# Patient Record
Sex: Male | Born: 1945 | Race: Black or African American | Hispanic: No | Marital: Single | Smoking: Former smoker
Health system: Southern US, Community
[De-identification: ages and names within clinical notes are randomized; demographics above are authoritative.]

## PROBLEM LIST (undated history)

## (undated) DIAGNOSIS — E119 Type 2 diabetes mellitus without complications: Secondary | ICD-10-CM

## (undated) DIAGNOSIS — M1990 Unspecified osteoarthritis, unspecified site: Secondary | ICD-10-CM

## (undated) DIAGNOSIS — K219 Gastro-esophageal reflux disease without esophagitis: Secondary | ICD-10-CM

## (undated) DIAGNOSIS — B20 Human immunodeficiency virus [HIV] disease: Secondary | ICD-10-CM

## (undated) DIAGNOSIS — I10 Essential (primary) hypertension: Secondary | ICD-10-CM

## (undated) DIAGNOSIS — Z21 Asymptomatic human immunodeficiency virus [HIV] infection status: Secondary | ICD-10-CM

---

## 2021-02-17 ENCOUNTER — Other Ambulatory Visit: Payer: Self-pay | Admitting: Neurosurgery

## 2021-03-07 NOTE — Pre-Procedure Instructions (Signed)
Surgical Instructions    Your procedure is scheduled on Friday June 17th  Report to [facility] Main Entrance "A" at 08:00 A.M., then check in with the Admitting office.  Call this number if you have problems the morning of surgery:  [phone]   If you have any questions prior to your surgery date call [phone]: Open Monday-Friday 8am-4pm    Remember:  Do not eat or drink after midnight the night before your surgery      Take these medicines the morning of surgery with A SIP OF WATER  ictegravir-emtricitabine-tenofovir AF (BIKTARVY) ezetimibe (ZETIA) pantoprazole (PROTONIX) rosuvastatin (CRESTOR)   As of today, STOP taking any Aspirin (unless otherwise instructed by your surgeon) Aleve, Naproxen, Ibuprofen, Motrin, Advil, Goody's, BC's, all herbal medications, fish oil, and all vitamins.  WHAT DO I DO ABOUT MY DIABETES MEDICATION?   Do not take oral diabetes medicines (pills) the morning of surgery.   Do not take empagliflozin (JARDIANCE) the day before surgery or the morning of surgery.   Do not take metFORMIN (GLUCOPHAGE) the morning of surgery.   THE NIGHT BEFORE SURGERY, take 12.5 units (50% of normal dose) of  insulin glargine if needed         THE NIGHT BEFORE SURGERY take your usual dose of insulin lispro (HUMALOG) before supper if      needed.   The day of surgery, do not take other diabetes injectables, including Byetta (exenatide), Bydureon (exenatide ER), Victoza (liraglutide), or Trulicity (dulaglutide).  If your CBG is greater than 220 mg/dL, you may take  of your sliding scale (correction) dose of insulin.   HOW TO MANAGE YOUR DIABETES BEFORE AND AFTER SURGERY  Why is it important to control my blood sugar before and after surgery? Improving blood sugar levels before and after surgery helps healing and can limit problems. A way of improving blood sugar control is eating a healthy diet by:  Eating less sugar and carbohydrates  Increasing  activity/exercise  Talking with your doctor about reaching your blood sugar goals High blood sugars (greater than 180 mg/dL) can raise your risk of infections and slow your recovery, so you will need to focus on controlling your diabetes during the weeks before surgery. Make sure that the doctor who takes care of your diabetes knows about your planned surgery including the date and location.  How do I manage my blood sugar before surgery? Check your blood sugar at least 4 times a day, starting 2 days before surgery, to make sure that the level is not too high or low.  Check your blood sugar the morning of your surgery when you wake up and every 2 hours until you get to the Short Stay unit.  If your blood sugar is less than 70 mg/dL, you will need to treat for low blood sugar: Do not take insulin. Treat a low blood sugar (less than 70 mg/dL) with  cup of clear juice (cranberry or apple), 4 glucose tablets, OR glucose gel. Recheck blood sugar in 15 minutes after treatment (to make sure it is greater than 70 mg/dL). If your blood sugar is not greater than 70 mg/dL on recheck, call [phone] for further instructions. Report your blood sugar to the short stay nurse when you get to Short Stay.  If you are admitted to the hospital after surgery: Your blood sugar will be checked by the staff and you will probably be given insulin after surgery (instead of oral diabetes medicines) to make sure you have  good blood sugar levels. The goal for blood sugar control after surgery is 80-180 mg/dL.                      Do NOT Smoke (Tobacco/Vaping) or drink Alcohol 24 hours prior to your procedure.  If you use a CPAP at night, you may bring all equipment for your overnight stay.   Contacts, glasses, piercing's, hearing aid's, dentures or partials may not be worn into surgery, please bring cases for these belongings.    For patients admitted to the hospital, discharge time will be determined by your  treatment team.   Patients discharged the day of surgery will not be allowed to drive home, and someone needs to stay with them for 24 hours.    Special instructions:   - Preparing For Surgery  Before surgery, you can play an important role. Because skin is not sterile, your skin needs to be as free of germs as possible. You can reduce the number of germs on your skin by washing with CHG (chlorahexidine gluconate) Soap before surgery.  CHG is an antiseptic cleaner which kills germs and bonds with the skin to continue killing germs even after washing.    Oral Hygiene is also important to reduce your risk of infection.  Remember - BRUSH YOUR TEETH THE MORNING OF SURGERY WITH YOUR REGULAR TOOTHPASTE  Please do not use if you have an allergy to CHG or antibacterial soaps. If your skin becomes reddened/irritated stop using the CHG.  Do not shave (including legs and underarms) for at least 48 hours prior to first CHG shower. It is OK to shave your face.  Please follow these instructions carefully.   Shower the NIGHT BEFORE SURGERY and the MORNING OF SURGERY  If you chose to wash your hair, wash your hair first as usual with your normal shampoo.  After you shampoo, rinse your hair and body thoroughly to remove the shampoo.  Use CHG Soap as you would any other liquid soap. You can apply CHG directly to the skin and wash gently with a scrungie or a clean washcloth.   Apply the CHG Soap to your body ONLY FROM THE NECK DOWN.  Do not use on open wounds or open sores. Avoid contact with your eyes, ears, mouth and genitals (private parts). Wash Face and genitals (private parts)  with your normal soap.   Wash thoroughly, paying special attention to the area where your surgery will be performed.  Thoroughly rinse your body with warm water from the neck down.  DO NOT shower/wash with your normal soap after using and rinsing off the CHG Soap.  Pat yourself dry with a CLEAN TOWEL.  Wear  CLEAN PAJAMAS to bed the night before surgery  Place CLEAN SHEETS on your bed the night before your surgery  DO NOT SLEEP WITH PETS.   Day of Surgery: Shower with CHG soap. Do not wear jewelry, make up or nail polish. Do not wear lotions, powders, perfumes/colognes, or deodorant. Do not shave 48 hours prior to surgery.  Men may shave face and neck. Do not bring valuables to the hospital. [facility] is not responsible for any belongings or valuables. Wear Clean/Comfortable clothing the morning of surgery Remember to brush your teeth WITH YOUR REGULAR TOOTHPASTE.   Please read over the following fact sheets that you were given.

## 2021-03-08 ENCOUNTER — Encounter (HOSPITAL_COMMUNITY)
Admission: RE | Admit: 2021-03-08 | Discharge: 2021-03-08 | Disposition: A | Discharge status: Home / Self Care | Payer: No Typology Code available for payment source | Source: Ambulatory Visit | Attending: Neurosurgery | Admitting: Neurosurgery

## 2021-03-08 ENCOUNTER — Other Ambulatory Visit: Payer: Self-pay

## 2021-03-08 ENCOUNTER — Encounter (HOSPITAL_COMMUNITY): Payer: Self-pay

## 2021-03-08 DIAGNOSIS — I10 Essential (primary) hypertension: Secondary | ICD-10-CM | POA: Insufficient documentation

## 2021-03-08 DIAGNOSIS — Z79899 Other long term (current) drug therapy: Secondary | ICD-10-CM | POA: Insufficient documentation

## 2021-03-08 DIAGNOSIS — E119 Type 2 diabetes mellitus without complications: Secondary | ICD-10-CM | POA: Insufficient documentation

## 2021-03-08 DIAGNOSIS — M4802 Spinal stenosis, cervical region: Secondary | ICD-10-CM | POA: Insufficient documentation

## 2021-03-08 DIAGNOSIS — Z20822 Contact with and (suspected) exposure to covid-19: Secondary | ICD-10-CM | POA: Insufficient documentation

## 2021-03-08 DIAGNOSIS — Z87891 Personal history of nicotine dependence: Secondary | ICD-10-CM | POA: Insufficient documentation

## 2021-03-08 DIAGNOSIS — Z01818 Encounter for other preprocedural examination: Secondary | ICD-10-CM | POA: Insufficient documentation

## 2021-03-08 DIAGNOSIS — Z7984 Long term (current) use of oral hypoglycemic drugs: Secondary | ICD-10-CM | POA: Insufficient documentation

## 2021-03-08 HISTORY — DX: Unspecified osteoarthritis, unspecified site: M19.90

## 2021-03-08 HISTORY — DX: Type 2 diabetes mellitus without complications: E11.9

## 2021-03-08 HISTORY — DX: Essential (primary) hypertension: I10

## 2021-03-08 HISTORY — DX: Gastro-esophageal reflux disease without esophagitis: K21.9

## 2021-03-08 LAB — BASIC METABOLIC PANEL
Anion gap: 12 (ref 5–15)
BUN: 7 mg/dL — ABNORMAL LOW (ref 8–23)
CO2: 25 mmol/L (ref 22–32)
Calcium: 9.3 mg/dL (ref 8.9–10.3)
Chloride: 104 mmol/L (ref 98–111)
Creatinine, Ser: 0.79 mg/dL (ref 0.61–1.24)
GFR, Estimated: >60 mL/min (ref >60)
Glucose, Bld: 110 mg/dL — ABNORMAL HIGH (ref 70–99)
Potassium: 3.6 mmol/L (ref 3.5–5.1)
Sodium: 141 mmol/L (ref 135–145)

## 2021-03-08 LAB — CBC WITH DIFFERENTIAL/PLATELET
Abs Immature Granulocytes: 0.02 10*3/uL (ref 0.00–0.07)
Basophils Absolute: 0 10*3/uL (ref 0.0–0.1)
Basophils Relative: 1 %
Eosinophils Absolute: 0.2 10*3/uL (ref 0.0–0.5)
Eosinophils Relative: 4 %
HCT: 40.3 % (ref 39.0–52.0)
Hemoglobin: 13.1 g/dL (ref 13.0–17.0)
Immature Granulocytes: 1 %
Lymphocytes Relative: 33 %
Lymphs Abs: 1.4 10*3/uL (ref 0.7–4.0)
MCH: 30.7 pg (ref 26.0–34.0)
MCHC: 32.5 g/dL (ref 30.0–36.0)
MCV: 94.4 fL (ref 80.0–100.0)
Monocytes Absolute: 0.4 10*3/uL (ref 0.1–1.0)
Monocytes Relative: 10 %
Neutro Abs: 2.2 10*3/uL (ref 1.7–7.7)
Neutrophils Relative %: 51 %
Platelets: 147 10*3/uL — ABNORMAL LOW (ref 150–400)
RBC: 4.27 MIL/uL (ref 4.22–5.81)
RDW: 13.3 % (ref 11.5–15.5)
WBC: 4.1 10*3/uL (ref 4.0–10.5)
nRBC: 0 % (ref 0.0–0.2)

## 2021-03-08 LAB — SARS CORONAVIRUS 2 (TAT 6-24 HRS): SARS Coronavirus 2: NEGATIVE

## 2021-03-08 LAB — SURGICAL PCR SCREEN
MRSA, PCR: NEGATIVE
Staphylococcus aureus: NEGATIVE

## 2021-03-08 LAB — GLUCOSE, CAPILLARY: Glucose-Capillary: 103 mg/dL — ABNORMAL HIGH (ref 70–99)

## 2021-03-08 NOTE — Progress Notes (Signed)
PCP - Dr. Joetta Manners Cardiologist - denies  PPM/ICD - n/a Device Orders - n/a Rep Notified - n/a  Chest x-ray - n/a EKG - 03/08/21 Stress Test - denies ECHO - denies Cardiac Cath - denies  Sleep Study - denies CPAP - n/a  CBG today= 103 Patient wears a Dexacon G6 on his right abdomen Patient states blood sugar ranges from 113-123   Blood Thinner Instructions: n/a Aspirin Instructions: n/a  ERAS Protcol - NPO after midnight   COVID TEST- 03/08/21 Pending   Anesthesia review: Yes. EKG review. BP 153/97 and 157/103 when rechecked. Called and made Jaynie Collins, PA aware. Patient states he takes Lisinopril nightly and he thought "Lisinopril was a medication for blood sugar." Patient states he is scheduled to see Dr. Joetta Manners at [facility] on Thursday for routine follow up. Per Jaynie Collins patient knows to inform Dr. Carolyne Fiscal of his upcoming surgery and today's BP readings so she is aware also.   Patient denies shortness of breath, fever, cough and chest pain at PAT appointment   All instructions explained to the patient, with a verbal understanding of the material. Patient agrees to go over the instructions while at home for a better understanding. Patient also instructed to self quarantine after being tested for COVID-19. The opportunity to ask questions was provided.

## 2021-03-09 ENCOUNTER — Encounter (HOSPITAL_COMMUNITY): Payer: Self-pay

## 2021-03-09 LAB — HEMOGLOBIN A1C
Hgb A1c MFr Bld: 6.2 % — ABNORMAL HIGH (ref 4.8–5.6)
Mean Plasma Glucose: 131 mg/dL

## 2021-03-09 NOTE — Progress Notes (Addendum)
Anesthesia Chart Review:  Case: [id number] Date/Time: 03/11/21 1020   Procedure: ACDF - C3-C4 - C4-C5 - C5-C6   Anesthesia type: General   Pre-op diagnosis: Stenosis   Location: [facility] OR ROOM 35 / [place] [facility]   Surgeons: Earnie Larsson, MD       DISCUSSION: Patient is a 75 year old male scheduled for the above procedure.  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    BP elevated at PAT. He had readings of 157/103 and 153/97. He is on lisinopril, but reported BP normally not as high and may have component of "white coat" hypertension. BP reading 112-130/72-81 at [facility] ~ 07/2020-11/2020. He already has routine follow-up with Verdell Carmine., MD  on 03/10/21, so he will get BP rechecked there. Labs and EKG also faxed to Dr. Harrell Lark for her records. Patient denied SOB and chest pain.   03/08/2021 presurgical COVID-19 test negative.  UPDATE 03/10/21 1:56 PM: BP at visit with Dr. Harrell Lark today was 142/88. It appeasr he was started on Toprol XL 25 mg daily. The note is otherwise still pending.    VS: BP (!) 153/97   Pulse 87   Temp 36.8 C (Oral)   Resp 18   Ht '5\' 6"'  (1.676 m)   Wt 66.3 kg   SpO2 99%   BMI 23.60 kg/m  BP 130/80 12/07/20  BP 114/72 08/27/20 BP 120/81 07/27/20   PROVIDERS: Verdell Carmine., MD is PCP [facility], see Care Everywhere) - Verner Chol, MD is HEM-ONC. Last visit 07/27/20 [facility]). On 11/19/2019 M spike near gamma region of 0.85 G/DL. Immunoelectrophoresis showed IgM kappa. He wrote, "Bone marrow biopsy did not reveal lymphoma. Did reveal a polytypic plasmacytosis 5 to 10% and a small tiny amount of monotypic plasma cells 0.04%. If his CT scans are negative then his IgM is produced by a monoclonal plasma cell population that is tiny and would be identified as a MGUS". He notes  patient with mild chronic thrombocytopenia with normal sized spleen and LLL pulmonary nodule being followed by 02/02/20 CT imaging (at the [facility]). Six month follow-up planned.  Eyvonne Mechanic, MD is ID [facility]) - He has been having RN visit with [facility]  [place].   LABS: Labs reviewed: Acceptable for surgery. (all labs ordered are listed, but only abnormal results are displayed)  Labs Reviewed  GLUCOSE, CAPILLARY - Abnormal; Notable for the following components:      Result Value   Glucose-Capillary 103 (*)    All other components within normal limits  CBC WITH DIFFERENTIAL/PLATELET - Abnormal; Notable for the following components:   Platelets 147 (*)    All other components within normal limits  BASIC METABOLIC PANEL - Abnormal; Notable for the following components:   Glucose, Bld 110 (*)    BUN 7 (*)    All other components within normal limits  HEMOGLOBIN A1C - Abnormal; Notable for the following components:   Hgb A1c MFr Bld 6.2 (*)    All other components within normal limits  SURGICAL PCR SCREEN  SARS CORONAVIRUS 2 (TAT 6-24 HRS)  TYPE AND SCREEN     IMAGES: CT Chest/abd/pelvis 02/02/20: IMPRESSION:  1. No lymphadenopathy in the chest, abdomen or pelvis.  2. Signs of pulmonary emphysema.  3. Pulmonary nodule in the LEFT  lower lobe (image 73, series 4)  measuring 7 mm. Non-contrast chest CT at 6-12 months is recommended.  If the nodule is stable at time of repeat CT, then future CT at  18-24 months (from today's scan) is considered optional for low-risk  patients, but is recommended for high-risk patients. This  recommendation follows the consensus statement: Guidelines for  Management of Incidental Pulmonary Nodules Detected on CT Images:  From the Fleischner Society 2017; Radiology 2017; 284:228-243.  4. Hepatic steatosis.  5. Distal pancreas is absent or markedly atrophic but without  biliary ductal or pancreatic ductal dilation and without  significant  change dating back to 2008. Finding may represent an acquired or  congenital variant.  6. Emphysema and aortic atherosclerosis.   [facility] HEM-ONC notes indicate that patient was going to have his f/u CT done through the [facility].)   EKG: 03/08/21: Sinus rhythm with Premature atrial complexes Septal infarct , age undetermined Abnormal ECG No previous tracing Confirmed by Vernell Leep ([id number]) on 03/08/2021 8:26:27 PM   CV: N/A  Past Medical History:  Diagnosis Date   Arthritis    Bilateral Hands   Diabetes mellitus without complication (HCC)    GERD (gastroesophageal reflux disease)    Hypertension     History reviewed. No pertinent surgical history.  MEDICATIONS:  bictegravir-emtricitabine-tenofovir AF (BIKTARVY) 50-200-25 MG TABS tablet   Cholecalciferol (VITAMIN D) 50 MCG (2000 UT) tablet   empagliflozin (JARDIANCE) 25 MG TABS tablet   ezetimibe (ZETIA) 10 MG tablet   insulin glargine, 1 Unit Dial, (TOUJEO SOLOSTAR) 300 UNIT/ML Solostar Pen   insulin lispro (HUMALOG) 100 UNIT/ML KwikPen   lisinopril (ZESTRIL) 2.5 MG tablet   metFORMIN (GLUCOPHAGE) 1000 MG tablet   pantoprazole (PROTONIX) 40 MG tablet   Polyethyl Glycol-Propyl Glycol (SYSTANE OP)   rosuvastatin (CRESTOR) 20 MG tablet   vitamin B-12 (CYANOCOBALAMIN) 1000 MCG tablet   No current facility-administered medications for this encounter.    Myra Gianotti, PA-C Surgical Short Stay/Anesthesiology [facility] Phone [phone] [facility] Phone [phone] 03/09/2021 2:00 PM

## 2021-03-10 NOTE — Anesthesia Preprocedure Evaluation (Addendum)
Anesthesia Evaluation  Patient identified by MRN, date of birth, ID band Patient awake    Reviewed: Allergy & Precautions, NPO status , Patient's Chart, lab work & pertinent test results  Airway Mallampati: II  TM Distance: >3 FB Neck ROM: Limited    Dental  (+) Edentulous Upper, Edentulous Lower   Pulmonary former smoker,    breath sounds clear to auscultation       Cardiovascular hypertension,  Rhythm:Regular Rate:Normal     Neuro/Psych    GI/Hepatic   Endo/Other  diabetes  Renal/GU      Musculoskeletal   Abdominal   Peds  Hematology   Anesthesia Other Findings   Reproductive/Obstetrics                            Anesthesia Physical Anesthesia Plan  ASA: 3  Anesthesia Plan: General   Post-op Pain Management:    Induction: Intravenous  PONV Risk Score and Plan: Ondansetron and Dexamethasone  Airway Management Planned: Oral ETT  Additional Equipment:   Intra-op Plan:   Post-operative Plan: Extubation in OR  Informed Consent: I have reviewed the patients History and Physical, chart, labs and discussed the procedure including the risks, benefits and alternatives for the proposed anesthesia with the patient or authorized representative who has indicated his/her understanding and acceptance.       Plan Discussed with: CRNA and Anesthesiologist  Anesthesia Plan Comments: (PAT note written by Shonna Chock, PA-C. 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    )       Anesthesia Quick Evaluation

## 2021-03-11 ENCOUNTER — Inpatient Hospital Stay (HOSPITAL_COMMUNITY): Payer: No Typology Code available for payment source

## 2021-03-11 ENCOUNTER — Inpatient Hospital Stay (HOSPITAL_COMMUNITY)
Admission: RE | Disposition: A | Discharge status: Home / Self Care | Payer: Self-pay | Source: Home / Self Care | Attending: Neurosurgery

## 2021-03-11 ENCOUNTER — Encounter (HOSPITAL_COMMUNITY): Payer: Self-pay | Admitting: Neurosurgery

## 2021-03-11 ENCOUNTER — Inpatient Hospital Stay (HOSPITAL_COMMUNITY)
Admission: RE | Admit: 2021-03-11 | Discharge: 2021-03-14 | DRG: 472 | Disposition: A | Discharge status: Home / Self Care | Payer: No Typology Code available for payment source | Attending: Neurosurgery | Admitting: Neurosurgery

## 2021-03-11 ENCOUNTER — Inpatient Hospital Stay (HOSPITAL_COMMUNITY): Payer: No Typology Code available for payment source | Admitting: Vascular Surgery

## 2021-03-11 ENCOUNTER — Other Ambulatory Visit: Payer: Self-pay

## 2021-03-11 DIAGNOSIS — Z21 Asymptomatic human immunodeficiency virus [HIV] infection status: Secondary | ICD-10-CM | POA: Diagnosis present

## 2021-03-11 DIAGNOSIS — Z79899 Other long term (current) drug therapy: Secondary | ICD-10-CM

## 2021-03-11 DIAGNOSIS — G959 Disease of spinal cord, unspecified: Secondary | ICD-10-CM | POA: Diagnosis present

## 2021-03-11 DIAGNOSIS — Z8249 Family history of ischemic heart disease and other diseases of the circulatory system: Secondary | ICD-10-CM | POA: Diagnosis not present

## 2021-03-11 DIAGNOSIS — I10 Essential (primary) hypertension: Secondary | ICD-10-CM | POA: Diagnosis present

## 2021-03-11 DIAGNOSIS — M4802 Spinal stenosis, cervical region: Secondary | ICD-10-CM | POA: Diagnosis present

## 2021-03-11 DIAGNOSIS — I9751 Accidental puncture and laceration of a circulatory system organ or structure during a circulatory system procedure: Secondary | ICD-10-CM | POA: Diagnosis not present

## 2021-03-11 DIAGNOSIS — M19042 Primary osteoarthritis, left hand: Secondary | ICD-10-CM | POA: Diagnosis present

## 2021-03-11 DIAGNOSIS — K08109 Complete loss of teeth, unspecified cause, unspecified class: Secondary | ICD-10-CM | POA: Diagnosis present

## 2021-03-11 DIAGNOSIS — Z794 Long term (current) use of insulin: Secondary | ICD-10-CM | POA: Diagnosis not present

## 2021-03-11 DIAGNOSIS — M2578 Osteophyte, vertebrae: Secondary | ICD-10-CM | POA: Diagnosis present

## 2021-03-11 DIAGNOSIS — Z833 Family history of diabetes mellitus: Secondary | ICD-10-CM | POA: Diagnosis not present

## 2021-03-11 DIAGNOSIS — Z87891 Personal history of nicotine dependence: Secondary | ICD-10-CM | POA: Diagnosis not present

## 2021-03-11 DIAGNOSIS — Z882 Allergy status to sulfonamides status: Secondary | ICD-10-CM | POA: Diagnosis not present

## 2021-03-11 DIAGNOSIS — Z419 Encounter for procedure for purposes other than remedying health state, unspecified: Secondary | ICD-10-CM

## 2021-03-11 DIAGNOSIS — R296 Repeated falls: Secondary | ICD-10-CM | POA: Diagnosis present

## 2021-03-11 DIAGNOSIS — K219 Gastro-esophageal reflux disease without esophagitis: Secondary | ICD-10-CM | POA: Diagnosis present

## 2021-03-11 DIAGNOSIS — M4712 Other spondylosis with myelopathy, cervical region: Secondary | ICD-10-CM | POA: Diagnosis present

## 2021-03-11 DIAGNOSIS — E119 Type 2 diabetes mellitus without complications: Secondary | ICD-10-CM | POA: Diagnosis present

## 2021-03-11 DIAGNOSIS — M19041 Primary osteoarthritis, right hand: Secondary | ICD-10-CM | POA: Diagnosis present

## 2021-03-11 DIAGNOSIS — Z20822 Contact with and (suspected) exposure to covid-19: Secondary | ICD-10-CM | POA: Diagnosis present

## 2021-03-11 DIAGNOSIS — R1310 Dysphagia, unspecified: Secondary | ICD-10-CM | POA: Diagnosis present

## 2021-03-11 DIAGNOSIS — Z7984 Long term (current) use of oral hypoglycemic drugs: Secondary | ICD-10-CM

## 2021-03-11 HISTORY — PX: ANTERIOR CERVICAL DECOMP/DISCECTOMY FUSION: SHX1161

## 2021-03-11 HISTORY — DX: Asymptomatic human immunodeficiency virus (hiv) infection status: Z21

## 2021-03-11 HISTORY — DX: Human immunodeficiency virus (HIV) disease: B20

## 2021-03-11 LAB — GLUCOSE, CAPILLARY
Glucose-Capillary: 108 mg/dL — ABNORMAL HIGH (ref 70–99)
Glucose-Capillary: 143 mg/dL — ABNORMAL HIGH (ref 70–99)
Glucose-Capillary: 156 mg/dL — ABNORMAL HIGH (ref 70–99)
Glucose-Capillary: 145 mg/dL — ABNORMAL HIGH (ref 70–99)

## 2021-03-11 LAB — CBC
HCT: 37.5 % — ABNORMAL LOW (ref 39.0–52.0)
HCT: 35.5 % — ABNORMAL LOW (ref 39.0–52.0)
Hemoglobin: 12.2 g/dL — ABNORMAL LOW (ref 13.0–17.0)
Hemoglobin: 11.7 g/dL — ABNORMAL LOW (ref 13.0–17.0)
MCH: 29.6 pg (ref 26.0–34.0)
MCH: 30.1 pg (ref 26.0–34.0)
MCHC: 32.5 g/dL (ref 30.0–36.0)
MCHC: 33 g/dL (ref 30.0–36.0)
MCV: 91 fL (ref 80.0–100.0)
MCV: 91.3 fL (ref 80.0–100.0)
Platelets: 106 10*3/uL — ABNORMAL LOW (ref 150–400)
Platelets: 109 10*3/uL — ABNORMAL LOW (ref 150–400)
RBC: 4.12 MIL/uL — ABNORMAL LOW (ref 4.22–5.81)
RBC: 3.89 MIL/uL — ABNORMAL LOW (ref 4.22–5.81)
RDW: 14.8 % (ref 11.5–15.5)
RDW: 14.9 % (ref 11.5–15.5)
WBC: 8.7 10*3/uL (ref 4.0–10.5)
WBC: 7.4 10*3/uL (ref 4.0–10.5)
nRBC: 0 % (ref 0.0–0.2)
nRBC: 0 % (ref 0.0–0.2)

## 2021-03-11 LAB — PREPARE RBC (CROSSMATCH)

## 2021-03-11 LAB — ABO/RH: ABO/RH(D): A POS

## 2021-03-11 SURGERY — ANTERIOR CERVICAL DECOMPRESSION/DISCECTOMY FUSION 3 LEVELS
Anesthesia: General | Site: Spine Cervical

## 2021-03-11 MED ORDER — INSULIN GLARGINE 100 UNIT/ML ~~LOC~~ SOLN
25.0000 [IU] | Freq: Every day | SUBCUTANEOUS | Status: DC
  Filled 2021-03-11: qty 0.25

## 2021-03-11 MED ORDER — DEXAMETHASONE SODIUM PHOSPHATE 10 MG/ML IJ SOLN
INTRAMUSCULAR | Status: AC
  Filled 2021-03-11: qty 1

## 2021-03-11 MED ORDER — ORAL CARE MOUTH RINSE: 15.0000 mL | Freq: Once | OROMUCOSAL | Status: AC

## 2021-03-11 MED ORDER — FENTANYL CITRATE (PF) 250 MCG/5ML IJ SOLN
INTRAMUSCULAR | Status: AC
  Filled 2021-03-11: qty 5

## 2021-03-11 MED ORDER — ONDANSETRON HCL 4 MG/2ML IJ SOLN
INTRAMUSCULAR | Status: AC
  Filled 2021-03-11: qty 2

## 2021-03-11 MED ORDER — ONDANSETRON HCL 4 MG/2ML IJ SOLN
4.0000 mg | Freq: Four times a day (QID) | INTRAMUSCULAR | Status: DC | PRN
  Administered 2021-03-11: 4 mg via INTRAVENOUS
  Filled 2021-03-11: qty 2

## 2021-03-11 MED ORDER — HYDROCODONE-ACETAMINOPHEN 5-325 MG PO TABS
1.0000 | ORAL_TABLET | ORAL | Status: DC | PRN
  Administered 2021-03-12 – 2021-03-13 (×4): 1 via ORAL
  Filled 2021-03-11 (×4): qty 1

## 2021-03-11 MED ORDER — METOPROLOL SUCCINATE ER 25 MG PO TB24
25.0000 mg | ORAL_TABLET | Freq: Every day | ORAL | Status: DC
  Administered 2021-03-12 – 2021-03-14 (×3): 25 mg via ORAL
  Filled 2021-03-11 (×3): qty 1

## 2021-03-11 MED ORDER — OXYCODONE HCL 5 MG PO TABS: 5.0000 mg | ORAL_TABLET | Freq: Once | ORAL | Status: DC | PRN

## 2021-03-11 MED ORDER — CYCLOBENZAPRINE HCL 10 MG PO TABS
10.0000 mg | ORAL_TABLET | Freq: Three times a day (TID) | ORAL | Status: DC | PRN
  Administered 2021-03-11 – 2021-03-13 (×3): 10 mg via ORAL
  Filled 2021-03-11 (×3): qty 1

## 2021-03-11 MED ORDER — LIDOCAINE HCL (PF) 2 % IJ SOLN
INTRAMUSCULAR | Status: AC
  Filled 2021-03-11: qty 5

## 2021-03-11 MED ORDER — MENTHOL 3 MG MT LOZG
1.0000 | LOZENGE | OROMUCOSAL | Status: DC | PRN
  Filled 2021-03-11: qty 9

## 2021-03-11 MED ORDER — THROMBIN 5000 UNITS EX SOLR: OROMUCOSAL | Status: DC | PRN

## 2021-03-11 MED ORDER — ROCURONIUM BROMIDE 100 MG/10ML IV SOLN
INTRAVENOUS | Status: DC | PRN
  Administered 2021-03-11 (×2): 20 mg via INTRAVENOUS
  Administered 2021-03-11: 60 mg via INTRAVENOUS

## 2021-03-11 MED ORDER — CHLORHEXIDINE GLUCONATE 0.12 % MT SOLN
OROMUCOSAL | Status: AC
  Filled 2021-03-11: qty 15

## 2021-03-11 MED ORDER — FENTANYL CITRATE (PF) 250 MCG/5ML IJ SOLN
INTRAMUSCULAR | Status: DC | PRN
  Administered 2021-03-11 (×4): 50 ug via INTRAVENOUS

## 2021-03-11 MED ORDER — PHENYLEPHRINE HCL-NACL 10-0.9 MG/250ML-% IV SOLN
INTRAVENOUS | Status: DC | PRN
  Administered 2021-03-11: 15 ug/min via INTRAVENOUS

## 2021-03-11 MED ORDER — SODIUM CHLORIDE 0.9% FLUSH
3.0000 mL | Freq: Two times a day (BID) | INTRAVENOUS | Status: DC
  Administered 2021-03-11 – 2021-03-14 (×6): 3 mL via INTRAVENOUS

## 2021-03-11 MED ORDER — CHLORHEXIDINE GLUCONATE CLOTH 2 % EX PADS: 6.0000 | MEDICATED_PAD | Freq: Once | CUTANEOUS | Status: DC

## 2021-03-11 MED ORDER — EMPAGLIFLOZIN 25 MG PO TABS
25.0000 mg | ORAL_TABLET | Freq: Every day | ORAL | Status: DC
  Administered 2021-03-12 – 2021-03-14 (×3): 25 mg via ORAL
  Filled 2021-03-11 (×3): qty 1

## 2021-03-11 MED ORDER — INSULIN GLARGINE (1 UNIT DIAL) 300 UNIT/ML ~~LOC~~ SOPN: 25.0000 [IU] | PEN_INJECTOR | Freq: Every evening | SUBCUTANEOUS | Status: DC | PRN

## 2021-03-11 MED ORDER — ROSUVASTATIN CALCIUM 20 MG PO TABS
20.0000 mg | ORAL_TABLET | Freq: Every evening | ORAL | Status: DC
  Administered 2021-03-11 – 2021-03-13 (×3): 20 mg via ORAL
  Filled 2021-03-11 (×3): qty 1

## 2021-03-11 MED ORDER — THROMBIN 5000 UNITS EX SOLR
CUTANEOUS | Status: AC
  Filled 2021-03-11: qty 5000

## 2021-03-11 MED ORDER — HYDROMORPHONE HCL 1 MG/ML IJ SOLN
1.0000 mg | INTRAMUSCULAR | Status: DC | PRN
  Administered 2021-03-11 – 2021-03-14 (×3): 1 mg via INTRAVENOUS
  Filled 2021-03-11 (×3): qty 1

## 2021-03-11 MED ORDER — PANTOPRAZOLE SODIUM 40 MG PO TBEC
40.0000 mg | DELAYED_RELEASE_TABLET | Freq: Every day | ORAL | Status: DC
  Administered 2021-03-12 – 2021-03-14 (×3): 40 mg via ORAL
  Filled 2021-03-11 (×3): qty 1

## 2021-03-11 MED ORDER — CEFAZOLIN SODIUM-DEXTROSE 2-4 GM/100ML-% IV SOLN
INTRAVENOUS | Status: AC
  Filled 2021-03-11: qty 100

## 2021-03-11 MED ORDER — ONDANSETRON HCL 4 MG/2ML IJ SOLN
INTRAMUSCULAR | Status: DC | PRN
  Administered 2021-03-11: 4 mg via INTRAVENOUS

## 2021-03-11 MED ORDER — HYDROCODONE-ACETAMINOPHEN 10-325 MG PO TABS
2.0000 | ORAL_TABLET | ORAL | Status: DC | PRN
  Administered 2021-03-11 – 2021-03-14 (×5): 2 via ORAL
  Filled 2021-03-11 (×5): qty 2

## 2021-03-11 MED ORDER — THROMBIN 20000 UNITS EX SOLR
CUTANEOUS | Status: AC
  Filled 2021-03-11: qty 20000

## 2021-03-11 MED ORDER — IOHEXOL 350 MG/ML SOLN
75.0000 mL | Freq: Once | INTRAVENOUS | Status: AC | PRN
  Administered 2021-03-11: 75 mL via INTRAVENOUS

## 2021-03-11 MED ORDER — VITAMIN D3 25 MCG (1000 UNIT) PO TABS
2000.0000 [IU] | ORAL_TABLET | Freq: Every day | ORAL | Status: DC
  Administered 2021-03-12 – 2021-03-14 (×3): 2000 [IU] via ORAL
  Filled 2021-03-11 (×6): qty 2

## 2021-03-11 MED ORDER — CEFAZOLIN SODIUM-DEXTROSE 1-4 GM/50ML-% IV SOLN
1.0000 g | Freq: Three times a day (TID) | INTRAVENOUS | Status: AC
  Administered 2021-03-11 – 2021-03-12 (×2): 1 g via INTRAVENOUS
  Filled 2021-03-11 (×2): qty 50

## 2021-03-11 MED ORDER — ORAL CARE MOUTH RINSE
15.0000 mL | Freq: Two times a day (BID) | OROMUCOSAL | Status: DC
  Administered 2021-03-11 – 2021-03-14 (×6): 15 mL via OROMUCOSAL

## 2021-03-11 MED ORDER — 0.9 % SODIUM CHLORIDE (POUR BTL) OPTIME
TOPICAL | Status: DC | PRN
  Administered 2021-03-11: 1000 mL

## 2021-03-11 MED ORDER — ACETAMINOPHEN 325 MG PO TABS: 650.0000 mg | ORAL_TABLET | ORAL | Status: DC | PRN

## 2021-03-11 MED ORDER — SODIUM CHLORIDE 0.9% FLUSH
3.0000 mL | INTRAVENOUS | Status: DC | PRN
  Administered 2021-03-13: 3 mL via INTRAVENOUS

## 2021-03-11 MED ORDER — LACTATED RINGERS IV SOLN: INTRAVENOUS | Status: DC

## 2021-03-11 MED ORDER — ONDANSETRON HCL 4 MG PO TABS: 4.0000 mg | ORAL_TABLET | Freq: Four times a day (QID) | ORAL | Status: DC | PRN

## 2021-03-11 MED ORDER — ONDANSETRON HCL 4 MG/2ML IJ SOLN: 4.0000 mg | Freq: Once | INTRAMUSCULAR | Status: DC | PRN

## 2021-03-11 MED ORDER — VITAMIN B-12 1000 MCG PO TABS
1000.0000 ug | ORAL_TABLET | Freq: Every day | ORAL | Status: DC
  Administered 2021-03-12 – 2021-03-14 (×3): 1000 ug via ORAL
  Filled 2021-03-11 (×3): qty 1

## 2021-03-11 MED ORDER — DEXAMETHASONE SODIUM PHOSPHATE 10 MG/ML IJ SOLN
10.0000 mg | Freq: Once | INTRAMUSCULAR | Status: AC
  Administered 2021-03-11: 10 mg via INTRAVENOUS

## 2021-03-11 MED ORDER — POLYVINYL ALCOHOL 1.4 % OP SOLN
2.0000 [drp] | Freq: Two times a day (BID) | OPHTHALMIC | Status: DC
  Administered 2021-03-11 – 2021-03-13 (×4): 2 [drp] via OPHTHALMIC
  Filled 2021-03-11 (×2): qty 15

## 2021-03-11 MED ORDER — SUGAMMADEX SODIUM 200 MG/2ML IV SOLN
INTRAVENOUS | Status: DC | PRN
  Administered 2021-03-11: 100 mg via INTRAVENOUS
  Administered 2021-03-11 (×2): 50 mg via INTRAVENOUS

## 2021-03-11 MED ORDER — CEFAZOLIN SODIUM-DEXTROSE 2-4 GM/100ML-% IV SOLN
2.0000 g | INTRAVENOUS | Status: AC
  Administered 2021-03-11: 2 g via INTRAVENOUS

## 2021-03-11 MED ORDER — MIDAZOLAM HCL 2 MG/2ML IJ SOLN
INTRAMUSCULAR | Status: AC
  Filled 2021-03-11: qty 2

## 2021-03-11 MED ORDER — LISINOPRIL 5 MG PO TABS
2.5000 mg | ORAL_TABLET | Freq: Every day | ORAL | Status: DC
  Administered 2021-03-11 – 2021-03-13 (×3): 2.5 mg via ORAL
  Filled 2021-03-11 (×2): qty 1

## 2021-03-11 MED ORDER — ACETAMINOPHEN 650 MG RE SUPP: 650.0000 mg | RECTAL | Status: DC | PRN

## 2021-03-11 MED ORDER — METFORMIN HCL 500 MG PO TABS
1000.0000 mg | ORAL_TABLET | Freq: Two times a day (BID) | ORAL | Status: DC
  Administered 2021-03-11 – 2021-03-14 (×6): 1000 mg via ORAL
  Filled 2021-03-11 (×6): qty 2

## 2021-03-11 MED ORDER — ROCURONIUM BROMIDE 10 MG/ML (PF) SYRINGE
PREFILLED_SYRINGE | INTRAVENOUS | Status: AC
  Filled 2021-03-11: qty 10

## 2021-03-11 MED ORDER — DEXMEDETOMIDINE (PRECEDEX) IN NS 20 MCG/5ML (4 MCG/ML) IV SYRINGE
PREFILLED_SYRINGE | INTRAVENOUS | Status: DC | PRN
  Administered 2021-03-11 (×2): 8 ug via INTRAVENOUS

## 2021-03-11 MED ORDER — FENTANYL CITRATE (PF) 100 MCG/2ML IJ SOLN: INTRAMUSCULAR | Status: DC | PRN

## 2021-03-11 MED ORDER — LACTATED RINGERS IV SOLN: INTRAVENOUS | Status: DC | PRN

## 2021-03-11 MED ORDER — LIDOCAINE HCL (PF) 2 % IJ SOLN
INTRAMUSCULAR | Status: DC | PRN
  Administered 2021-03-11: 60 mg via INTRADERMAL
  Administered 2021-03-11: 100 mg via INTRADERMAL

## 2021-03-11 MED ORDER — FENTANYL CITRATE (PF) 100 MCG/2ML IJ SOLN
25.0000 ug | INTRAMUSCULAR | Status: DC | PRN
  Administered 2021-03-11: 50 ug via INTRAVENOUS

## 2021-03-11 MED ORDER — THROMBIN 20000 UNITS EX SOLR: CUTANEOUS | Status: DC | PRN

## 2021-03-11 MED ORDER — EZETIMIBE 10 MG PO TABS
10.0000 mg | ORAL_TABLET | Freq: Every day | ORAL | Status: DC
  Administered 2021-03-12 – 2021-03-14 (×3): 10 mg via ORAL
  Filled 2021-03-11 (×3): qty 1

## 2021-03-11 MED ORDER — FENTANYL CITRATE (PF) 100 MCG/2ML IJ SOLN
INTRAMUSCULAR | Status: AC
  Filled 2021-03-11: qty 2

## 2021-03-11 MED ORDER — POLYETHYL GLYCOL-PROPYL GLYCOL 0.4-0.3 % OP GEL: Freq: Two times a day (BID) | OPHTHALMIC | Status: DC

## 2021-03-11 MED ORDER — LABETALOL HCL 5 MG/ML IV SOLN: 10.0000 mg | INTRAVENOUS | Status: DC | PRN

## 2021-03-11 MED ORDER — CHLORHEXIDINE GLUCONATE CLOTH 2 % EX PADS
6.0000 | MEDICATED_PAD | Freq: Every day | CUTANEOUS | Status: DC
  Administered 2021-03-11 – 2021-03-14 (×4): 6 via TOPICAL

## 2021-03-11 MED ORDER — SODIUM CHLORIDE 0.9 % IV SOLN: 250.0000 mL | INTRAVENOUS | Status: DC

## 2021-03-11 MED ORDER — MIDAZOLAM HCL 5 MG/5ML IJ SOLN
INTRAMUSCULAR | Status: DC | PRN
  Administered 2021-03-11: 2 mg via INTRAVENOUS

## 2021-03-11 MED ORDER — BICTEGRAVIR-EMTRICITAB-TENOFOV 50-200-25 MG PO TABS
1.0000 | ORAL_TABLET | Freq: Every day | ORAL | Status: DC
  Administered 2021-03-12 – 2021-03-14 (×3): 1 via ORAL
  Filled 2021-03-11 (×3): qty 1

## 2021-03-11 MED ORDER — PROPOFOL 10 MG/ML IV BOLUS
INTRAVENOUS | Status: DC | PRN
  Administered 2021-03-11: 160 mg via INTRAVENOUS

## 2021-03-11 MED ORDER — PROPOFOL 10 MG/ML IV BOLUS
INTRAVENOUS | Status: AC
  Filled 2021-03-11: qty 20

## 2021-03-11 MED ORDER — ALBUMIN HUMAN 5 % IV SOLN: INTRAVENOUS | Status: DC | PRN

## 2021-03-11 MED ORDER — PHENOL 1.4 % MT LIQD: 1.0000 | OROMUCOSAL | Status: DC | PRN

## 2021-03-11 MED ORDER — OXYCODONE HCL 5 MG/5ML PO SOLN: 5.0000 mg | Freq: Once | ORAL | Status: DC | PRN

## 2021-03-11 MED ORDER — CHLORHEXIDINE GLUCONATE 0.12 % MT SOLN
15.0000 mL | Freq: Once | OROMUCOSAL | Status: AC
  Administered 2021-03-11: 15 mL via OROMUCOSAL

## 2021-03-11 MED ORDER — SODIUM CHLORIDE 0.9% IV SOLUTION: Freq: Once | INTRAVENOUS | Status: DC

## 2021-03-11 MED ORDER — INSULIN LISPRO (1 UNIT DIAL) 100 UNIT/ML (KWIKPEN): 6.0000 [IU] | PEN_INJECTOR | SUBCUTANEOUS | Status: DC

## 2021-03-11 MED ORDER — PHENYLEPHRINE 40 MCG/ML (10ML) SYRINGE FOR IV PUSH (FOR BLOOD PRESSURE SUPPORT)
PREFILLED_SYRINGE | INTRAVENOUS | Status: DC | PRN
Start: 2021-03-11 — End: 2021-03-11
  Administered 2021-03-11: 120 ug via INTRAVENOUS

## 2021-03-11 SURGICAL SUPPLY — 54 items
BAG DECANTER FOR FLEXI CONT (MISCELLANEOUS) ×3 IMPLANT
BAND RUBBER #18 3X1/16 STRL (MISCELLANEOUS) ×6 IMPLANT
BENZOIN TINCTURE PRP APPL 2/3 (GAUZE/BANDAGES/DRESSINGS) ×3 IMPLANT
BUR MATCHSTICK NEURO 3.0 LAGG (BURR) ×3 IMPLANT
CAGE PEEK 6X14X11 (Cage) ×6 IMPLANT
CANISTER SUCT 3000ML PPV (MISCELLANEOUS) ×3 IMPLANT
CARTRIDGE OIL MAESTRO DRILL (MISCELLANEOUS) ×1 IMPLANT
CLOSURE STERI-STRIP 1/2X4 (GAUZE/BANDAGES/DRESSINGS) ×1
CLOSURE WOUND 1/2 X4 (GAUZE/BANDAGES/DRESSINGS) ×1
CLSR STERI-STRIP ANTIMIC 1/2X4 (GAUZE/BANDAGES/DRESSINGS) ×2 IMPLANT
COVER WAND RF STERILE (DRAPES) ×3 IMPLANT
DIFFUSER DRILL AIR PNEUMATIC (MISCELLANEOUS) ×3 IMPLANT
DRAPE C-ARM 42X72 X-RAY (DRAPES) ×6 IMPLANT
DRAPE LAPAROTOMY 100X72 PEDS (DRAPES) ×3 IMPLANT
DRAPE MICROSCOPE LEICA (MISCELLANEOUS) ×3 IMPLANT
DURAPREP 6ML APPLICATOR 50/CS (WOUND CARE) ×3 IMPLANT
ELECT COATED BLADE 2.86 ST (ELECTRODE) ×3 IMPLANT
ELECT REM PT RETURN 9FT ADLT (ELECTROSURGICAL) ×3
ELECTRODE REM PT RTRN 9FT ADLT (ELECTROSURGICAL) ×1 IMPLANT
EVACUATOR 1/8 PVC DRAIN (DRAIN) ×3 IMPLANT
GAUZE 4X4 16PLY RFD (DISPOSABLE) IMPLANT
GAUZE SPONGE 4X4 12PLY STRL (GAUZE/BANDAGES/DRESSINGS) ×3 IMPLANT
GLOVE ECLIPSE 9.0 STRL (GLOVE) ×3 IMPLANT
GLOVE EXAM NITRILE XL STR (GLOVE) IMPLANT
GOWN STRL REUS W/ TWL LRG LVL3 (GOWN DISPOSABLE) ×3 IMPLANT
GOWN STRL REUS W/ TWL XL LVL3 (GOWN DISPOSABLE) ×2 IMPLANT
GOWN STRL REUS W/TWL 2XL LVL3 (GOWN DISPOSABLE) IMPLANT
GOWN STRL REUS W/TWL LRG LVL3 (GOWN DISPOSABLE) ×6
GOWN STRL REUS W/TWL XL LVL3 (GOWN DISPOSABLE) ×4
HALTER HD/CHIN CERV TRACTION D (MISCELLANEOUS) ×3 IMPLANT
HEMOSTAT POWDER KIT SURGIFOAM (HEMOSTASIS) ×6 IMPLANT
KIT BASIN OR (CUSTOM PROCEDURE TRAY) ×3 IMPLANT
KIT TURNOVER KIT B (KITS) ×3 IMPLANT
NEEDLE SPNL 20GX3.5 QUINCKE YW (NEEDLE) ×3 IMPLANT
NS IRRIG 1000ML POUR BTL (IV SOLUTION) ×3 IMPLANT
OIL CARTRIDGE MAESTRO DRILL (MISCELLANEOUS) ×3
PACK LAMINECTOMY NEURO (CUSTOM PROCEDURE TRAY) ×3 IMPLANT
PAD ARMBOARD 7.5X6 YLW CONV (MISCELLANEOUS) ×9 IMPLANT
PATTIES SURGICAL 1X1 (DISPOSABLE) ×3 IMPLANT
PLATE 3 55XLCK NS SPNE CVD (Plate) ×1 IMPLANT
PLATE 3 ATLANTIS TRANS (Plate) ×2 IMPLANT
SCREW ST FIX 4 ATL 3120213 (Screw) ×24 IMPLANT
SPACER SPNL 11X14X6XPEEK CVD (Cage) ×3 IMPLANT
SPCR SPNL 11X14X6XPEEK CVD (Cage) ×3 IMPLANT
SPONGE INTESTINAL PEANUT (DISPOSABLE) ×3 IMPLANT
SPONGE SURGIFOAM ABS GEL 100 (HEMOSTASIS) ×3 IMPLANT
STRIP CLOSURE SKIN 1/2X4 (GAUZE/BANDAGES/DRESSINGS) ×2 IMPLANT
SUT VIC AB 3-0 SH 8-18 (SUTURE) ×3 IMPLANT
SUT VIC AB 4-0 RB1 18 (SUTURE) ×3 IMPLANT
TAPE CLOTH 4X10 WHT NS (GAUZE/BANDAGES/DRESSINGS) ×3 IMPLANT
TOWEL GREEN STERILE (TOWEL DISPOSABLE) ×3 IMPLANT
TOWEL GREEN STERILE FF (TOWEL DISPOSABLE) ×3 IMPLANT
TRAP SPECIMEN MUCUS 40CC (MISCELLANEOUS) ×3 IMPLANT
WATER STERILE IRR 1000ML POUR (IV SOLUTION) ×3 IMPLANT

## 2021-03-11 NOTE — Anesthesia Postprocedure Evaluation (Signed)
Anesthesia Post Note  Patient: Production manager  Procedure(s) Performed: Anterior Cervical discectomy and Fusion  - Cervical three-Cervical four - Cervical four-Cervical five - Cervical five -Cervical six (Spine Cervical)     Patient location during evaluation: PACU Anesthesia Type: General Level of consciousness: awake and alert Pain management: pain level controlled Vital Signs Assessment: post-procedure vital signs reviewed and stable Respiratory status: spontaneous breathing, nonlabored ventilation, respiratory function stable and patient connected to nasal cannula oxygen Cardiovascular status: blood pressure returned to baseline and stable Postop Assessment: no apparent nausea or vomiting Anesthetic complications: no   No notable events documented.  Last Vitals:  Vitals:   03/11/21 1510 03/11/21 1600  BP:    Pulse: 85   Resp: 16   Temp:  36.6 C  SpO2: 95%     Last Pain:  Vitals:   03/11/21 1600  TempSrc: Oral  PainSc:                  Demarus Latterell COKER

## 2021-03-11 NOTE — Brief Op Note (Signed)
03/11/2021  1:21 PM  PATIENT:  Scott [name]  75 y.o. male  PRE-OPERATIVE DIAGNOSIS:  Stenosis  POST-OPERATIVE DIAGNOSIS:  Stenosis  PROCEDURE:  Procedure(s): Anterior Cervical discectomy and Fusion  - Cervical three-Cervical four - Cervical four-Cervical five - Cervical five -Cervical six (N/A)  SURGEON:  Surgeon(s) and Role:    * Scott Sicks, MD - Primary    * [name], [name], MD - Assisting  PHYSICIAN ASSISTANT:   ASSISTANTSMarland [name]   ANESTHESIA:   general  EBL:  1250 mL   BLOOD ADMINISTERED:none  DRAINS: (med) Hemovact drain(s) in the prevertebral space with  Suction Open   LOCAL MEDICATIONS USED:  NONE  SPECIMEN:  No Specimen  DISPOSITION OF SPECIMEN:  N/A  COUNTS:  YES  TOURNIQUET:  * No tourniquets in log *  DICTATION: .Dragon Dictation  PLAN OF CARE: Admit to inpatient   PATIENT DISPOSITION:  PACU - hemodynamically stable.   Delay start of Pharmacological VTE agent (>24hrs) due to surgical blood loss or risk of bleeding: yes

## 2021-03-11 NOTE — Progress Notes (Signed)
Post op check  Pt sedated but awakens easily. Aware and oriented.  Speech fluent.Denies diplopia.  Facial sensation normal bilat. Tongue protrudes to midline.  No Nystagmus.  Motor stable from preop if not a little improved.(4+ bilat grips and intins, 4+ bilat LE's)  neck soft , WC/D/I.  Breathing easily.  Drain output low  Ct angio pending  Stable post op cont ICU obs and BP control.  No signs of brainstem injury

## 2021-03-11 NOTE — H&P (Signed)
Scott [name] is an 75 y.o. male.   Chief Complaint: Weakness HPI: 75 year old male with progressive bilateral upper and lower extremity weakness and spasticity.  Patient is status post multiple falls.  Work-up demonstrates evidence of significant cervical spondylosis with stenosis with high signal abnormality within the spinal cord itself at the C3-4 level.  Patient presents now for anterior cervical decompression and fusion at this C3-4, C4-5 and C5-6 in hopes of stabilizing and and possibly improving his situation.  Past Medical History:  Diagnosis Date   Arthritis    Bilateral Hands   Diabetes mellitus without complication (HCC)    GERD (gastroesophageal reflux disease)    HIV (human immunodeficiency virus infection) (HCC)    Hypertension     History reviewed. No pertinent surgical history.  Family History  Problem Relation Age of Onset   Hypertension Mother    Diabetes Father    Cancer Brother    Social History:  reports that he quit smoking about 6 years ago. His smoking use included cigarettes. He has been exposed to tobacco smoke. He has never used smokeless tobacco. He reports current alcohol use. He reports previous drug use.  Allergies:  Allergies  Allergen Reactions   Sulfamethoxazole Hives    Medications Prior to Admission  Medication Sig Dispense Refill   bictegravir-emtricitabine-tenofovir AF (BIKTARVY) 50-200-25 MG TABS tablet Take 1 tablet by mouth daily.     Cholecalciferol (VITAMIN D) 50 MCG (2000 UT) tablet Take 2,000 Units by mouth daily.     empagliflozin (JARDIANCE) 25 MG TABS tablet Take 25 mg by mouth daily.     ezetimibe (ZETIA) 10 MG tablet Take 10 mg by mouth daily.     lisinopril (ZESTRIL) 2.5 MG tablet Take 2.5 mg by mouth at bedtime.     metFORMIN (GLUCOPHAGE) 1000 MG tablet Take 1,000 mg by mouth 2 (two) times daily with a meal.     metoprolol succinate (TOPROL-XL) 25 MG 24 hr tablet Take 25 mg by mouth daily.     pantoprazole (PROTONIX)  40 MG tablet Take 40 mg by mouth daily.     Polyethyl Glycol-Propyl Glycol (SYSTANE OP) Place 1 drop into both eyes 2 (two) times daily.     rosuvastatin (CRESTOR) 20 MG tablet Take 20 mg by mouth every evening.     vitamin B-12 (CYANOCOBALAMIN) 1000 MCG tablet Take 1,000 mcg by mouth daily.     insulin glargine, 1 Unit Dial, (TOUJEO SOLOSTAR) 300 UNIT/ML Solostar Pen Inject 25 Units into the skin at bedtime as needed (blood sugar over 150).     insulin lispro (HUMALOG) 100 UNIT/ML KwikPen Inject 6 Units into the skin See admin instructions. Inject 6 units at supper as needed for blood sugar over 150      Results for orders placed or performed during the hospital encounter of 03/11/21 (from the past 48 hour(s))  Glucose, capillary     Status: Abnormal   Collection Time: 03/11/21  8:28 AM  Result Value Ref Range   Glucose-Capillary 108 (H) 70 - 99 mg/dL    Comment: Glucose reference range applies only to samples taken after fasting for at least 8 hours.  ABO/Rh     Status: None   Collection Time: 03/11/21  8:35 AM  Result Value Ref Range   ABO/RH(D)      A POS Performed at [facility], [address], [place], [place] [address]    No results found.  Pertinent items noted in HPI and remainder of  comprehensive ROS otherwise negative.  Pulse 79, temperature 98.2 F (36.8 C), temperature source Oral, resp. rate 18, height 5\' 6"  (1.676 m), weight 66.3 kg, SpO2 97 %.  Patient is awake and alert.  He is oriented and appropriate.  Speech is fluent.  Judgment insight appear intact.  Cranial nerve function normal bilateral.  Motor examination reveals diffuse weakness of his distal upper extremities with moderate grip strength and intrinsic weakness.  Patient with spastic weakness in both lower extremities.  Sensory examination with patchy sensory loss in both distal upper extremities and lower extremities.  Reflexes are increased.  Hoffmann's responses are present in both hands.  Examination  head ears eyes nose and throat is unremarked.  Chest and abdomen are benign.  Extremities are free from injury or deformity. Assessment/Plan Cervical stenosis with myelopathy.  Plan C3-4, C4-5, C5-6 anterior cervical discectomy with interbody fusion utilizing interbody cages, local harvested autograft, and anterior plate instrumentation.  Risks and benefits been explained.  Patient wishes to proceed.  A Laini Urick 03/11/2021, 9:58 AM

## 2021-03-11 NOTE — Progress Notes (Signed)
All of patient belongings and valuables (listed in previous note) were sent home with Stephanie Acre (pt niece).

## 2021-03-11 NOTE — Op Note (Signed)
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  Asst. Surgeon: Johnsie Cancel, MD; Doran Durand, MD  Anesthesia: General  Indication: 75 year old male with progressive cervical myelopathy involving upper extremity weakness and sensory loss with loss of dexterity as well as lower extremity spasticity and gait abnormality.  Work-up demonstrates evidence of significant spondylosis and stenosis with spinal cord compression high signal abnormality at C3-4, C4-5 and C5-6.  Patient presents now for anterior cervical decompression and fusion in hopes improving his symptoms.  Operative note: After induction of anesthesia, patient positioned supine with Extended held placed halter traction.  Patient's anterior cervical region prepped and draped sterilely.  Incision made overlying C5.  Dissection performed on the right.  Retractor placed.  Fluoroscopy used.  Levels confirmed.  Disc base C3-4, C4-5 and C5-6 were incised.  Discectomy was then performed using various instruments down from the posterior annulus.  Microscope was then brought in field used microdissection of the spinal canal.  Remaining aspects of annulus and osteophytes removed using high-speed drill down to the level of posterior logical limb.  Posterior logical was elevated and resected piecemeal fashion.  Underlying thecal sac was identified.  Wide central decompression then performed undercutting the bodies of C3 and C4.  Decompression then proceeded each neural foramina.  Wide anterior foraminotomies was first performed along the course exiting left C4 nerve root.  Upon performing foraminotomies along the right C4 nerve  root there was then injury to the right vertebral artery causing significant bleeding.  This was controlled with packing.  At this point a very thorough decompression of been achieved at [facility] and attention then placed to C4-5 and C5-6.  Decompression was then performed at both these levels in a similar fashion with good decompression of the spinal canal and neural foramina.  No evidence of vascular injury at these levels fortunately.  Wound was then irrigated.  6 mm Medtronic anatomic peek cage was then impacted into place and recessed slightly from the anterior cortical margins at all 3 mm.  Packing was removed from the right C3-4 foramina.  Additional Gelfoam and some Surgicel was placed.  Packing was replaced and then removed.  Hemostasis was achieved.  There is no evidence of any arterial bleeding at all with a normal blood pressure confirmed by anesthesia.  At this point it is felt he may have 6) discussed situation with him tomorrow.  Recommended a postoperative CT angiogram.  Medtronic translational Atlantis plate was then placed over the C3, C4, C5 and C6 levels.  This then attached fluoroscopic guidance using 13 mm fixed angle screws to each at all levels.  Locking screws engaged all levels.  Final images reveal good position of the cages and hardware at the proper upper level with normal alignment of spine.  Wound was then irrigated 1 final time.  A medium Hemovac drain was left in the prevertebral space.  Wound is then closed in layers.  Steri-Strips and sterile dressing were applied.  Patient seemed to tolerate the procedure well and returns to recovery.

## 2021-03-11 NOTE — Progress Notes (Signed)
Patient admitted to 4N from PACU with belongings including:   Upper and lower dentures Glasses Clothing: pants, shirt,  Shoes Cell phone and charger Keys Dexcom glucometer Wallet including 1 debit card, 1 credit card, $46 dollars cash. Pt wishes to keep items at bedside at this time.

## 2021-03-11 NOTE — Anesthesia Procedure Notes (Signed)
Procedure Name: Intubation Date/Time: 03/11/2021 10:44 AM Performed by: Noel Christmas, RN Pre-anesthesia Checklist: Patient identified, Emergency Drugs available, Suction available and Patient being monitored Patient Re-evaluated:Patient Re-evaluated prior to induction Oxygen Delivery Method: Circle System Utilized Preoxygenation: Pre-oxygenation with 100% oxygen Induction Type: IV induction Ventilation: Mask ventilation without difficulty and Oral airway inserted - appropriate to patient size Laryngoscope Size: Glidescope and 4 Grade View: Grade II Tube type: Oral Tube size: 7.5 mm Number of attempts: 1 Airway Equipment and Method: Stylet and Oral airway Placement Confirmation: ETT inserted through vocal cords under direct vision, positive ETCO2 and breath sounds checked- equal and bilateral Secured at: 22 cm Tube secured with: Tape Dental Injury: Teeth and Oropharynx as per pre-operative assessment

## 2021-03-11 NOTE — Transfer of Care (Signed)
Immediate Anesthesia Transfer of Care Note  Patient: Aravind Conwell Kau  Procedure(s) Performed: Anterior Cervical discectomy and Fusion  - Cervical three-Cervical four - Cervical four-Cervical five - Cervical five -Cervical six (Spine Cervical)  Patient Location: PACU  Anesthesia Type:General  Level of Consciousness: drowsy and responds to stimulation  Airway & Oxygen Therapy: Patient Spontanous Breathing and Patient connected to face mask oxygen  Post-op Assessment: Report given to RN and Post -op Vital signs reviewed and stable  Post vital signs: Reviewed and stable  Last Vitals:  Vitals Value Taken Time  BP 141/94 03/11/21 1415  Temp 36.5 C 03/11/21 1345  Pulse 76 03/11/21 1421  Resp 15 03/11/21 1421  SpO2 93 % 03/11/21 1421  Vitals shown include unvalidated device data.  Last Pain:  Vitals:   03/11/21 1345  TempSrc:   PainSc: Asleep         Complications: No notable events documented.

## 2021-03-12 LAB — CBC
HCT: 35 % — ABNORMAL LOW (ref 39.0–52.0)
HCT: 38.4 % — ABNORMAL LOW (ref 39.0–52.0)
Hemoglobin: 11.7 g/dL — ABNORMAL LOW (ref 13.0–17.0)
Hemoglobin: 12.6 g/dL — ABNORMAL LOW (ref 13.0–17.0)
MCH: 30.3 pg (ref 26.0–34.0)
MCH: 30.3 pg (ref 26.0–34.0)
MCHC: 33.4 g/dL (ref 30.0–36.0)
MCHC: 32.8 g/dL (ref 30.0–36.0)
MCV: 90.7 fL (ref 80.0–100.0)
MCV: 92.3 fL (ref 80.0–100.0)
Platelets: 111 10*3/uL — ABNORMAL LOW (ref 150–400)
Platelets: 126 10*3/uL — ABNORMAL LOW (ref 150–400)
RBC: 3.86 MIL/uL — ABNORMAL LOW (ref 4.22–5.81)
RBC: 4.16 MIL/uL — ABNORMAL LOW (ref 4.22–5.81)
RDW: 14.8 % (ref 11.5–15.5)
RDW: 14.8 % (ref 11.5–15.5)
WBC: 7.9 10*3/uL (ref 4.0–10.5)
WBC: 7.7 10*3/uL (ref 4.0–10.5)
nRBC: 0 % (ref 0.0–0.2)
nRBC: 0 % (ref 0.0–0.2)

## 2021-03-12 LAB — GLUCOSE, CAPILLARY
Glucose-Capillary: 136 mg/dL — ABNORMAL HIGH (ref 70–99)
Glucose-Capillary: 158 mg/dL — ABNORMAL HIGH (ref 70–99)
Glucose-Capillary: 145 mg/dL — ABNORMAL HIGH (ref 70–99)
Glucose-Capillary: 143 mg/dL — ABNORMAL HIGH (ref 70–99)

## 2021-03-12 NOTE — Progress Notes (Signed)
Neurosurgery Service Progress Note  Subjective: No acute events overnight, some odynophagia, no dizziness / no dysarthria  Objective: Vitals:   03/12/21 0300 03/12/21 0400 03/12/21 0500 03/12/21 0600  BP: 131/80 138/86 (!) 132/91 129/85  Pulse: 80 78 81 81  Resp: 15 16 17 14   Temp:  98.3 F (36.8 C)    TempSrc:  Oral    SpO2: 97% 98% 98% 98%  Weight:      Height:        Physical Exam: AOx3, PERRL, EOMI, FS, TM, Strength diffusely 4+/5 x4, SILTx4, no drift, no hoffman's Incision c/d/I, neck soft  Assessment & Plan: 75 y.o. man s/p 3 level ACDF w/ vert injury, recovering well.  -CTA shows packing in place, no new hemorrhage, hardware in good position with some hardware artifact but no obvious occlusion or pseudoaneurysm; exam improved from preop per pt -okay to transfer to stepdown  67 A Hanish Laraia  03/12/21 7:00 AM

## 2021-03-13 LAB — GLUCOSE, CAPILLARY
Glucose-Capillary: 98 mg/dL (ref 70–99)
Glucose-Capillary: 118 mg/dL — ABNORMAL HIGH (ref 70–99)
Glucose-Capillary: 215 mg/dL — ABNORMAL HIGH (ref 70–99)

## 2021-03-13 MED ORDER — DEXAMETHASONE SODIUM PHOSPHATE 4 MG/ML IJ SOLN
4.0000 mg | Freq: Two times a day (BID) | INTRAMUSCULAR | Status: DC
  Administered 2021-03-13 – 2021-03-14 (×3): 4 mg via INTRAVENOUS
  Filled 2021-03-13 (×3): qty 1

## 2021-03-13 MED ORDER — SODIUM CHLORIDE 0.9 % IV SOLN: INTRAVENOUS | Status: DC

## 2021-03-13 NOTE — Progress Notes (Signed)
Orthopedic Tech Progress Note Patient Details:  Scott [name] [birth year] [id number]  Went to deliver/apply soft collar, patient already had one on.  Patient ID: [name], male   DOB: 08-Oct-1945, 75 y.o.   MRN: [id number]  Docia Furl 03/13/2021, 3:37 PM

## 2021-03-13 NOTE — Progress Notes (Signed)
Neurosurgery Service Progress Note  Subjective: No acute events overnight, +significant dysphagia with solids, no respiratory distress / stridor, voice normal  Objective: Vitals:   03/13/21 0500 03/13/21 0600 03/13/21 0700 03/13/21 0800  BP: 106/69 123/82 (!) 136/92 138/82  Pulse: 74 73 76 84  Resp: 14 14 14 20   Temp:      TempSrc:      SpO2: 95% 96% 97% 93%  Weight:      Height:        Physical Exam: AOx3, PERRL, EOMI, FS, TM, Strength diffusely 4+/5 x4, SILTx4, no drift, no hoffman's Incision c/d/I, neck soft  Assessment & Plan: 75 y.o. man s/p 3 level ACDF w/ vert injury, recovering well. CTA with patent vertebrals  -will start some IVF and steroids to help with the dysphagia -okay to transfer to stepdown  66  03/13/21 8:55 AM

## 2021-03-13 NOTE — Care Plan (Signed)
Report called to Rn [id number]. Patient with no complaints at the current time. Will transfer with nurse to new room.

## 2021-03-14 ENCOUNTER — Encounter (HOSPITAL_COMMUNITY): Payer: Self-pay | Admitting: Neurosurgery

## 2021-03-14 LAB — GLUCOSE, CAPILLARY
Glucose-Capillary: 135 mg/dL — ABNORMAL HIGH (ref 70–99)
Glucose-Capillary: 154 mg/dL — ABNORMAL HIGH (ref 70–99)

## 2021-03-14 MED ORDER — CYCLOBENZAPRINE HCL 10 MG PO TABS
10.0000 mg | ORAL_TABLET | Freq: Three times a day (TID) | ORAL | 0 refills | Status: AC | PRN
Start: 2021-03-14 — End: ?

## 2021-03-14 MED ORDER — METHYLPREDNISOLONE 4 MG PO TBPK: ORAL_TABLET | ORAL | 0 refills | Status: AC

## 2021-03-14 MED ORDER — HYDROCODONE-ACETAMINOPHEN 5-325 MG PO TABS: 1.0000 | ORAL_TABLET | ORAL | 0 refills | Status: AC | PRN

## 2021-03-14 MED FILL — Thrombin For Soln 5000 Unit: CUTANEOUS | Qty: 5000 | Status: AC

## 2021-03-14 NOTE — Discharge Instructions (Signed)

## 2021-03-14 NOTE — Progress Notes (Signed)
[place] 72-hour notification ID: [id number]  Joaquin Courts, MSW, [facility]

## 2021-03-14 NOTE — Evaluation (Signed)
Occupational Therapy Evaluation Patient Details Name: Scott [name] MRN: [id number] DOB: 1946-08-11 Today's Date: 03/14/2021    History of Present Illness Pt is a 75 year old man admitted on 03/11/21 for ACDF C 3-4, 4-5, 5-6. Hospital course complicated by dysphagia. PMH:  DM2, GERD, HTN, MGUS, HIV, pulmonary nodule.   Clinical Impression   Pt was living alone and functioning independently prior to admission. Niece in room at time of assessment and reports pt is fiercely independent and family can assist pt as needed when he returns home. Pt presents with shoulder pain, greater on R vs L. He requires min assist for bed mobility using log roll technique, mod assist for hips to EOB and min assist for ambulation. He walks tentatively and would likely benefit from AD, recommend PT consult, as he does have a hx of falls. Pt receptive to all education. Sp02 94% or above on RA, left [place] off and notified RN. Will follow acutely.     Follow Up Recommendations  Home health OT;Supervision - Intermittent    Equipment Recommendations  Tub/shower bench (pt is requesting a cane or walker)    Recommendations for Other Services       Precautions / Restrictions Precautions Precautions: Fall;Cervical Precaution Booklet Issued: Yes (comment) Precaution Comments: reviewed written handout, pt reports falls prior to admission Required Braces or Orthoses: Cervical Brace Cervical Brace: Soft collar      Mobility Bed Mobility Overal bed mobility: Needs Assistance Bed Mobility: Rolling;Sidelying to Sit Rolling: Min guard Sidelying to sit: Min assist       General bed mobility comments: cues for log roll technique, assist to raise trunk, increased time    Transfers Overall transfer level: Needs assistance Equipment used: None Transfers: Sit to/from Stand Sit to Stand: Min guard         General transfer comment: assist for hips to EOB, tentative with standing    Balance Overall balance  assessment: Needs assistance   Sitting balance-Leahy Scale: Good       Standing balance-Leahy Scale: Fair                             ADL either performed or assessed with clinical judgement   ADL Overall ADL's : Needs assistance/impaired Eating/Feeding: Set up;Sitting Eating/Feeding Details (indicate cue type and reason): tends to lead with L (non dominant hand) Grooming: Set up;Sitting;Wash/dry hands   Upper Body Bathing: Minimal assistance;Sitting Upper Body Bathing Details (indicate cue type and reason): recommended long handled bath sponge for back Lower Body Bathing: Min guard;Sit to/from stand Lower Body Bathing Details (indicate cue type and reason): recommended long handled bath sponge Upper Body Dressing : Minimal assistance;Sitting Upper Body Dressing Details (indicate cue type and reason): due to lines Lower Body Dressing: Min guard;Sit to/from stand Lower Body Dressing Details (indicate cue type and reason): able to perform figure 4 to reach feet Toilet Transfer: Minimal assistance;Ambulation   Toileting- Clothing Manipulation and Hygiene: Minimal assistance;Sit to/from stand Toileting - Clothing Manipulation Details (indicate cue type and reason): instructed to avoid twisting with pericare     Functional mobility during ADLs: Minimal assistance General ADL Comments: Instructed in IADL to avoid, family endorses they can assist.     Vision Baseline Vision/History: Wears glasses Wears Glasses: Reading only Patient Visual Report: No change from baseline       Perception     Praxis      Pertinent Vitals/Pain Pain Assessment: Faces Faces  Pain Scale: Hurts even more Pain Location: R shoulder primarily Pain Descriptors / Indicators: Grimacing;Guarding;Discomfort Pain Intervention(s): Monitored during session;Premedicated before session;Repositioned     Hand Dominance Right   Extremity/Trunk Assessment Upper Extremity Assessment Upper Extremity  Assessment: Generalized weakness (reluctant to raise arms due to fear of pain, able to perform hands to top of head)   Lower Extremity Assessment Lower Extremity Assessment: Generalized weakness   Cervical / Trunk Assessment Cervical / Trunk Assessment: Other exceptions Cervical / Trunk Exceptions: s/p ACDF   Communication Communication Communication: No difficulties   Cognition Arousal/Alertness: Awake/alert Behavior During Therapy: WFL for tasks assessed/performed Overall Cognitive Status: Within Functional Limits for tasks assessed                                     General Comments       Exercises     Shoulder Instructions      Home Living Family/patient expects to be discharged to:: Private residence Living Arrangements: Alone Available Help at Discharge: Family;Available PRN/intermittently Type of Home: Apartment Home Access: Level entry     Home Layout: One level     Bathroom Shower/Tub: Chief Strategy Officer: Standard     Home Equipment: Bedside commode   Additional Comments: BSC over toilet      Prior Functioning/Environment Level of Independence: Independent        Comments: walking without a device, takes public transportation when needed, niece in room and confirms family members can care for pt at home        OT Problem List: Decreased strength;Impaired balance (sitting and/or standing);Decreased coordination;Decreased knowledge of use of DME or AE;Impaired UE functional use;Pain;Decreased knowledge of precautions      OT Treatment/Interventions: Self-care/ADL training;Therapeutic activities;Patient/family education;Balance training;DME and/or AE instruction    OT Goals(Current goals can be found in the care plan section) Acute Rehab OT Goals Patient Stated Goal: return home OT Goal Formulation: With patient Time For Goal Achievement: 03/28/21 Potential to Achieve Goals: Good ADL Goals Pt Will Perform  Grooming: with modified independence;standing Pt Will Perform Lower Body Bathing: with modified independence;with adaptive equipment;sit to/from stand Pt Will Perform Lower Body Dressing: with modified independence;sit to/from stand;with adaptive equipment Pt Will Transfer to Toilet: with modified independence;ambulating;bedside commode (over toilet) Pt Will Perform Tub/Shower Transfer: Tub transfer;with modified independence;ambulating;tub bench Additional ADL Goal #1: Pt will adhere to cervical precautions during ADL and mobility.  OT Frequency: Min 2X/week   Barriers to D/C:            Co-evaluation              AM-PAC OT "6 Clicks" Daily Activity     Outcome Measure Help from another person eating meals?: None Help from another person taking care of personal grooming?: A Little Help from another person toileting, which includes using toliet, bedpan, or urinal?: A Little Help from another person bathing (including washing, rinsing, drying)?: A Little Help from another person to put on and taking off regular upper body clothing?: A Little   6 Click Score: 16   End of Session Equipment Utilized During Treatment: Cervical collar;Gait belt Nurse Communication: Other (comment) (ok to leave off 02)  Activity Tolerance: Patient tolerated treatment well Patient left: in chair;with call bell/phone within reach;with bed alarm set  OT Visit Diagnosis: Unsteadiness on feet (R26.81);Other abnormalities of gait and mobility (R26.89);Pain;Muscle weakness (generalized) (M62.81)  Time: 6213-0865 OT Time Calculation (min): 37 min Charges:  OT General Charges $OT Visit: 1 Visit OT Evaluation $OT Eval Moderate Complexity: 1 Mod OT Treatments $Self Care/Home Management : 8-22 mins  Martie Round, OTR/L Acute Rehabilitation Services Pager: [phone] Office: [phone]  Evern Bio 03/14/2021, 10:59 AM

## 2021-03-14 NOTE — Plan of Care (Signed)
  Problem: Clinical Measurements: Goal: Postoperative complications will be avoided or minimized Outcome: Progressing   Problem: Skin Integrity: Goal: Demonstration of wound healing without infection will improve Outcome: Progressing   Problem: Education: Goal: Knowledge of the prescribed therapeutic regimen will improve Outcome: Progressing   Problem: Activity: Goal: Ability to tolerate increased activity will improve Outcome: Progressing   Problem: Health Behavior/Discharge Planning: Goal: Identification of resources available to assist in meeting health care needs will improve Outcome: Progressing   Problem: Nutrition: Goal: Maintenance of adequate nutrition will improve Outcome: Progressing   Problem: Clinical Measurements: Goal: Complications related to the disease process, condition or treatment will be avoided or minimized Outcome: Progressing   Problem: Respiratory: Goal: Will regain and/or maintain adequate ventilation Outcome: Progressing   Problem: Skin Integrity: Goal: Demonstration of wound healing without infection will improve Outcome: Progressing   Problem: Education: Goal: Knowledge of General Education information will improve Description: Including pain rating scale, medication(s)/side effects and non-pharmacologic comfort measures Outcome: Progressing   Problem: Health Behavior/Discharge Planning: Goal: Ability to manage health-related needs will improve Outcome: Progressing   Problem: Clinical Measurements: Goal: Ability to maintain clinical measurements within normal limits will improve Outcome: Progressing Goal: Will remain free from infection Outcome: Progressing Goal: Diagnostic test results will improve Outcome: Progressing Goal: Respiratory complications will improve Outcome: Progressing Goal: Cardiovascular complication will be avoided Outcome: Progressing   Problem: Activity: Goal: Risk for activity intolerance will  decrease Outcome: Progressing   Problem: Nutrition: Goal: Adequate nutrition will be maintained Outcome: Progressing   Problem: Coping: Goal: Level of anxiety will decrease Outcome: Progressing   Problem: Elimination: Goal: Will not experience complications related to bowel motility Outcome: Progressing Goal: Will not experience complications related to urinary retention Outcome: Progressing   Problem: Pain Managment: Goal: General experience of comfort will improve Outcome: Progressing   Problem: Safety: Goal: Ability to remain free from injury will improve Outcome: Progressing   Problem: Skin Integrity: Goal: Risk for impaired skin integrity will decrease Outcome: Progressing

## 2021-03-14 NOTE — Discharge Summary (Signed)
Physician Discharge Summary  Patient ID: Scott [name] MRN: [id number] DOB/AGE: [birth year] 75 y.o.  Admit date: 03/11/2021 Discharge date: 03/14/2021  Admission Diagnoses:  Discharge Diagnoses:  Active Problems:   Cervical myelopathy [facility])   Discharged Condition: good  Hospital Course: The patient was admitted to the hospital where he underwent 3 level anterior cervical decompression and fusion surgery for treatment of his compressive cervical myelopathy.  During the surgery there was a small laceration of his right vertebral artery which was controlled with intraoperative packing.  Postoperative CT angiogram demonstrates no evidence of any extravasation suggestive of further bleeding nor see any evidence of pseudoaneurysm or problem.  The patient has remained stable for the past 3 nights without any difficulties or problems.  The patient feels ready for discharge home.  Currently he has some mild anterior and posterior neck pain.  His preoperative numbness paresthesias and weakness are much improved.  He is standing and walking better.  He is voiding well.  He has some mild dysphagia.  He has no airway swelling or issues.  Consults:   Significant Diagnostic Studies:   Treatments:   Discharge Exam: Blood pressure (!) 143/80, pulse 84, temperature 98.1 F (36.7 C), temperature source Axillary, resp. rate 14, height 5\' 6"  (1.676 m), weight 66.3 kg, SpO2 98 %. Awake and alert.  Oriented and appropriate.  Motor examination with some minimal intrinsic weakness and mild spasticity.  Patient still have some mild spastic weakness in both lower extremities.  His gait is mildly unstable.  His wound is clean and dry.  His neck is very soft and flat.  Chest and abdomen are benign.  Disposition: Discharge disposition: 01-Home or Self Care        Allergies as of 03/14/2021       Reactions   Sulfamethoxazole Hives        Medication List     TAKE these medications    Biktarvy  50-200-25 MG Tabs tablet Generic drug: bictegravir-emtricitabine-tenofovir AF Take 1 tablet by mouth daily.   cyclobenzaprine 10 MG tablet Commonly known as: FLEXERIL Take 1 tablet (10 mg total) by mouth 3 (three) times daily as needed for muscle spasms.   empagliflozin 25 MG Tabs tablet Commonly known as: JARDIANCE Take 25 mg by mouth daily.   ezetimibe 10 MG tablet Commonly known as: ZETIA Take 10 mg by mouth daily.   HYDROcodone-acetaminophen 5-325 MG tablet Commonly known as: NORCO/VICODIN Take 1 tablet by mouth every 4 (four) hours as needed for moderate pain ((score 4 to 6)).   insulin lispro 100 UNIT/ML KwikPen Commonly known as: HUMALOG Inject 6 Units into the skin See admin instructions. Inject 6 units at supper as needed for blood sugar over 150   lisinopril 2.5 MG tablet Commonly known as: ZESTRIL Take 2.5 mg by mouth at bedtime.   metFORMIN 1000 MG tablet Commonly known as: GLUCOPHAGE Take 1,000 mg by mouth 2 (two) times daily with a meal.   methylPREDNISolone 4 MG Tbpk tablet Commonly known as: MEDROL DOSEPAK follow package directions   metoprolol succinate 25 MG 24 hr tablet Commonly known as: TOPROL-XL Take 25 mg by mouth daily.   pantoprazole 40 MG tablet Commonly known as: PROTONIX Take 40 mg by mouth daily.   rosuvastatin 20 MG tablet Commonly known as: CRESTOR Take 20 mg by mouth every evening.   SYSTANE OP Place 1 drop into both eyes 2 (two) times daily.   Toujeo SoloStar 300 UNIT/ML Solostar Pen Generic drug: insulin glargine (1 Unit  Dial) Inject 25 Units into the skin at bedtime as needed (blood sugar over 150).   vitamin B-12 1000 MCG tablet Commonly known as: CYANOCOBALAMIN Take 1,000 mcg by mouth daily.   Vitamin D 50 MCG (2000 UT) tablet Take 2,000 Units by mouth daily.         Signed: Kathaleen Maser Samantha Olivera 03/14/2021, 11:34 AM

## 2021-03-14 NOTE — Progress Notes (Signed)
Scott [name] to be D/C'd Home per MD order.  Discussed medications, wound care, follow up appointment and symptoms to report with the patient and all questions answered.   VSS, Skin clean, dry and intact without evidence of skin break down, no evidence of skin tears noted. IV catheter discontinued intact. Site without signs and symptoms of complications. Dressing and pressure applied.   An After Visit Summary was printed and given to the patient. Patient received information on prescription pick up.   D/c education completed with patient/family including follow up instructions, medication list, d/c activities limitations if indicated, with other d/c instructions as indicated by MD - patient able to verbalize understanding, all questions answered.    Patient instructed to return to ED, call 911, or call MD for any changes in condition.    Patient escorted via WC, and D/C home via private auto.                 Note Details  Author Velva Harman, RN File Time 02/18/2021  1:26 PM  Author Type Registered Nurse Status Incomplete  Last Editor Velva Harman, RN Service (none)  Hospital Acct # [id number] Admit Date 02/15/2021

## 2021-03-15 LAB — TYPE AND SCREEN
ABO/RH(D): A POS
Antibody Screen: NEGATIVE
Unit division: 0
Unit division: 0
Unit division: 0
Unit division: 0

## 2021-03-15 LAB — BPAM RBC
Blood Product Expiration Date: 202207082359
Blood Product Expiration Date: 202207082359
Blood Product Expiration Date: 202207082359
Blood Product Expiration Date: 202207082359
ISSUE DATE / TIME: 202206171205
ISSUE DATE / TIME: 202206171205
Unit Type and Rh: 6200
Unit Type and Rh: 6200
Unit Type and Rh: 6200
Unit Type and Rh: 6200

## 2021-08-24 ENCOUNTER — Ambulatory Visit: Payer: No Typology Code available for payment source | Attending: Neurosurgery | Admitting: Physical Therapy

## 2021-08-24 ENCOUNTER — Encounter: Payer: Self-pay | Admitting: Physical Therapy

## 2021-08-24 ENCOUNTER — Other Ambulatory Visit: Payer: Self-pay

## 2021-08-24 DIAGNOSIS — M25612 Stiffness of left shoulder, not elsewhere classified: Secondary | ICD-10-CM | POA: Insufficient documentation

## 2021-08-24 DIAGNOSIS — M6281 Muscle weakness (generalized): Secondary | ICD-10-CM | POA: Insufficient documentation

## 2021-08-24 DIAGNOSIS — M25611 Stiffness of right shoulder, not elsewhere classified: Secondary | ICD-10-CM | POA: Insufficient documentation

## 2021-08-24 DIAGNOSIS — R293 Abnormal posture: Secondary | ICD-10-CM | POA: Insufficient documentation

## 2021-08-24 DIAGNOSIS — M542 Cervicalgia: Secondary | ICD-10-CM | POA: Insufficient documentation

## 2021-08-24 DIAGNOSIS — R29898 Other symptoms and signs involving the musculoskeletal system: Secondary | ICD-10-CM | POA: Diagnosis present

## 2021-08-24 DIAGNOSIS — M546 Pain in thoracic spine: Secondary | ICD-10-CM | POA: Insufficient documentation

## 2021-08-24 NOTE — Patient Instructions (Signed)
   Access Code: [email or web address] URL: [email or web address] Date: 08/24/2021 Prepared by: Glenetta Hew  Exercises Seated Cervical Retraction - 2 x daily - 7 x weekly - 2 sets - 10 reps - 3-5 sec hold Seated Scapular Retraction - 2 x daily - 7 x weekly - 2 sets - 10 reps - 3-5 sec hold

## 2021-08-24 NOTE — Therapy (Signed)
[facility] Outpatient Rehabilitation [facility] [address]  [address] [place], [place], [address] Phone: [phone]   Fax:  [phone]  Physical Therapy Evaluation  Patient Details  Name: Scott [name] MRN: [id number] Date of Birth: 07-04-1946 Referring Provider (PT): Julio Sicks, MD   Encounter Date: 08/24/2021   PT End of Session - 08/24/21 1055     Visit Number 1    Number of Visits 15    Date for PT Re-Evaluation 10/19/21    Authorization Type [facility]    Authorization - Visit Number 1    Authorization - Number of Visits 15    PT Start Time 1055    PT Stop Time 1147    PT Time Calculation (min) 52 min    Activity Tolerance Patient tolerated treatment well    Behavior During Therapy [facility] for tasks assessed/performed             Past Medical History:  Diagnosis Date   Arthritis    Bilateral Hands   Diabetes mellitus without complication (HCC)    GERD (gastroesophageal reflux disease)    HIV (human immunodeficiency virus infection) (HCC)    Hypertension     Past Surgical History:  Procedure Laterality Date   ANTERIOR CERVICAL DECOMP/DISCECTOMY FUSION N/A 03/11/2021   Procedure: Anterior Cervical discectomy and Fusion  - Cervical three-Cervical four - Cervical four-Cervical five - Cervical five -Cervical six;  Surgeon: Julio Sicks, MD;  Location: [facility] OR;  Service: Neurosurgery;  Laterality: N/A;    There were no vitals filed for this visit.    Subjective Assessment - 08/24/21 1057     Subjective Pt s/p ACDF in June 2022 but stil has pain in his neck and shoulder blade areas. Also notes feeling of a lump in his neck when he tries to swallow. He denies any current precautions or restrictions related to his surgery.    Limitations Sitting;Lifting;House hold activities    How long can you sit comfortably? 45-60 minutes    Patient Stated Goals "to get where this pain will go away so I can get in and out of bed w/o pain"    Currently in Pain?  Yes    Pain Score 4    3-4/10   Pain Location Neck    Pain Orientation Posterior    Pain Descriptors / Indicators Dull;Constant    Pain Type Chronic pain    Pain Radiating Towards intermittent pain into B scapula with certain movements such getting in/out of bed or reaching overhead    Pain Onset More than a month ago    Pain Frequency Constant    Aggravating Factors  getting in/out of bed, reaching overhead, bending over    Pain Relieving Factors cease triggering activity; occasionally will take Tylenol oor prescription pain meds & muscle relaxants    Effect of Pain on Daily Activities difficulty getting in/out of bed or in/out of car; trouble reaching to top shelves in cabinets or grocery store                [facility] PT Assessment - 08/24/21 1055       Assessment   Medical Diagnosis Cervical spinal stenosis s/p C3-4, C4-5, C5-6 ACDF    Referring Provider (PT) Julio Sicks, MD    Onset Date/Surgical Date 03/11/21    Hand Dominance Right    Next MD Visit 09/07/21    Prior Therapy none      Precautions   Precautions Cervical  Precaution Comments no lifting >5-10#      Restrictions   Weight Bearing Restrictions No      Balance Screen   Has the patient fallen in the past 6 months No    Has the patient had a decrease in activity level because of a fear of falling?  Yes    Is the patient reluctant to leave their home because of a fear of falling?  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, art   Overall Cognitive Status Within Functional Limits for tasks assessed      Observation/Other Assessments   Focus on Therapeutic Outcomes (FOTO)  Neck = 47; predicted  D/C FS = 60      Posture/Postural Control   Posture/Postural Control Postural limitations    Postural Limitations Forward head;Rounded Shoulders      ROM / Strength   AROM / PROM / Strength AROM;Strength      AROM   AROM Assessment Site Cervical;Shoulder    Right/Left Shoulder Right;Left    Right Shoulder Flexion 116 Degrees    Right Shoulder ABduction 88 Degrees    Right Shoulder Internal Rotation --   FIR to L2   Right Shoulder External Rotation --   FER - unable   Left Shoulder Flexion 121 Degrees    Left Shoulder ABduction 105 Degrees    Left Shoulder Internal Rotation --   FIR to L2   Left Shoulder External Rotation --   FER - unable   Cervical Flexion 40    Cervical Extension 21    Cervical - Right Side Bend 14    Cervical - Left Side Bend 18    Cervical - Right Rotation 55    Cervical - Left Rotation 36      Strength   Overall Strength Comments shoulder strength for available ROM    Strength Assessment Site Shoulder;Hand    Right/Left Shoulder Right;Left    Right Shoulder Flexion 4/5    Right Shoulder Extension 4+/5    Right Shoulder ABduction 4/5    Right Shoulder Internal Rotation 4+/5    Right Shoulder External Rotation 4-/5    Left Shoulder Flexion 4/5    Left Shoulder Extension 4+/5    Left Shoulder ABduction 4/5    Left Shoulder Internal Rotation 4+/5    Left Shoulder External Rotation 4/5    Right/Left hand Right;Left    Right Hand Grip (lbs) 46   45, 46, 47   Left Hand Grip (lbs) 23.33   26, 20, 24     Palpation   Palpation comment TTP with increased muscle tension in B UT, LS, rhomboids, medial periscapular muscles & cervicothoracic paraspinals                        Objective measurements completed on examination: See above findings.                PT Education - 08/24/21 1145     Education Details PT eval findings, anticipated POC and initial HEP -  Access Code: [email or web address]    Person(s) Educated Patient    Methods  Explanation;Demonstration;Verbal cues;Handout    Comprehension Verbalized understanding;Verbal cues required;Returned demonstration;Need further instruction              PT Short Term Goals - 08/24/21 1147       PT SHORT TERM GOAL #1   Title Patient will be independent with initial HEP    Status New    Target Date 09/14/21      PT SHORT TERM GOAL #2   Title Patient will verbalize/demonstrate good awareness of neutral spine posture and proper body mechanics for daily tasks    Status New    Target Date 09/21/21               PT Long Term Goals - 08/24/21 1147       PT LONG TERM GOAL #1   Title Patient will be independent with ongoing/advanced HEP for self-management at home    Status New    Target Date 10/19/21      PT LONG TERM GOAL #2   Title Patient to demonstrate ability to achieve and maintain good spinal alignment/posturing    Status New    Target Date 10/19/21      PT LONG TERM GOAL #3   Title Patient to report reduction in frequency and intensity of cervicothoracic and periscapular pain by >/= 50% to allow for improved mobility and activity tolerance    Status New    Target Date 10/19/21      PT LONG TERM GOAL #4   Title Patient to improve cervical and B shoulder AROM to [facility] without pain provocation    Status New    Target Date 10/19/21      PT LONG TERM GOAL #5   Title Patient will demonstrate improved B shoulder strength to >/= 4+/5 and L grip strength to within 10# of R hand for functional UE use    Status New    Target Date 10/19/21      PT LONG TERM GOAL #6   Title Patient to report ability to perform transitional mobility (bed and car transfers), ADLs, and household activities without limitation due to pain, LOM or weakness    Status New    Target Date 10/19/21                    Plan - 08/24/21 1147     Clinical Impression Statement Scott [name] is a 75 y/o male who presents to OP PT for cervical myelopathy 2 cervical spinal stenosis  s/p C3-4, C4-5, C5-6 ACDF on 03/11/21. He reports resolution of presurgical R-sided pain since the ACDF but c/o ongoing pain in his posterior neck and medial periscapular region. He also reports increased difficulty swallowing with a sensation of a lump in his throat. Deficits include posterior cervicothroracic spine and B medial periscapular pain, postural abnormalities including forward head and rounded shoulder, limited and painful cervical and B shoulder AROM, mild shoulder weakness and decreased L grip strength, along with increased soft tissue restriction and TTP in B UT, LS, rhomboids and cervicothoracic paraspinals. Pain and LOM cause him difficulty getting in/out of bed or in/out of car as well as trouble reaching to top shelves in cabinets or at grocery store. Scott [name] will benefit from skilled PT to address above deficits to improve posture, restore pain free functional cervical and shoulder ROM and UE strength to improve tolerance for transitional mobility with bed and car transfers as well  as normal daily activities.    Personal Factors and Comorbidities Age;Comorbidity 3+;Past/Current Experience;Time since onset of injury/illness/exacerbation;Fitness;Transportation    Comorbidities Cervical myelopathy s/p C3-4, C4-5, C5-6 ACDF on 03/11/21, DM, HTN, HIV, GERD    Examination-Activity Limitations Bathing;Bed Mobility;Bend;Dressing;Reach Overhead;Lift;Sit;Transfers    Examination-Participation Restrictions Cleaning;Community Activity;Driving;Laundry;Meal Prep;Shop    Stability/Clinical Decision Making Evolving/Moderate complexity    Clinical Decision Making Moderate    Rehab Potential Good    PT Frequency 2x / week    PT Duration 8 weeks    PT Treatment/Interventions ADLs/Self Care Home Management;Cryotherapy;Electrical Stimulation;Moist Heat;Functional mobility training;Therapeutic activities;Therapeutic exercise;Balance training;Neuromuscular re-education;Patient/family education;Manual  techniques;Passive range of motion;Dry needling;Taping    PT Next Visit Plan Review initial HEP; postural stretching and strengthening; manual therapy +/- modalities to address pain and abnormal muscle tension    PT Home Exercise Plan Access Code: [email or web address] (11/30)    Consulted and Agree with Plan of Care Patient             Patient will benefit from skilled therapeutic intervention in order to improve the following deficits and impairments:  Decreased activity tolerance, Decreased knowledge of precautions, Decreased mobility, Decreased range of motion, Decreased strength, Increased fascial restricitons, Increased muscle spasms, Impaired perceived functional ability, Impaired flexibility, Impaired UE functional use, Improper body mechanics, Postural dysfunction, Pain  Visit Diagnosis: Cervicalgia  Pain in thoracic spine  Abnormal posture  Stiffness of right shoulder, not elsewhere classified  Stiffness of left shoulder, not elsewhere classified  Muscle weakness (generalized)  Other symptoms and signs involving the musculoskeletal system     Problem List Patient Active Problem List   Diagnosis Date Noted   Cervical myelopathy (HCC) 03/11/2021    Scott [name], PT 08/24/2021, 1:44 PM  [facility] Out[facility] Rehabilitation [facility] [address]  [address] [place], [place], [address] Phone: [phone]   Fax:  [phone]  Name: Scott [name] MRN: [id number] Date of Birth: September 29, 1945

## 2021-08-31 ENCOUNTER — Other Ambulatory Visit: Payer: Self-pay

## 2021-08-31 ENCOUNTER — Ambulatory Visit: Payer: No Typology Code available for payment source | Attending: Neurosurgery | Admitting: Physical Therapy

## 2021-08-31 ENCOUNTER — Encounter: Payer: Self-pay | Admitting: Physical Therapy

## 2021-08-31 DIAGNOSIS — M546 Pain in thoracic spine: Secondary | ICD-10-CM | POA: Diagnosis present

## 2021-08-31 DIAGNOSIS — M6281 Muscle weakness (generalized): Secondary | ICD-10-CM | POA: Diagnosis present

## 2021-08-31 DIAGNOSIS — M542 Cervicalgia: Secondary | ICD-10-CM | POA: Insufficient documentation

## 2021-08-31 DIAGNOSIS — M25611 Stiffness of right shoulder, not elsewhere classified: Secondary | ICD-10-CM | POA: Diagnosis present

## 2021-08-31 DIAGNOSIS — M25612 Stiffness of left shoulder, not elsewhere classified: Secondary | ICD-10-CM | POA: Diagnosis present

## 2021-08-31 DIAGNOSIS — R293 Abnormal posture: Secondary | ICD-10-CM | POA: Insufficient documentation

## 2021-08-31 DIAGNOSIS — R29898 Other symptoms and signs involving the musculoskeletal system: Secondary | ICD-10-CM | POA: Insufficient documentation

## 2021-08-31 NOTE — Patient Instructions (Signed)
    Access Code: [email or web address] URL: [email or web address] Date: 08/31/2021 Prepared by: Glenetta Hew  Exercises Seated Cervical Retraction - 2 x daily - 7 x weekly - 2 sets - 10 reps - 3-5 sec hold Seated Scapular Retraction - 2 x daily - 7 x weekly - 2 sets - 10 reps - 3-5 sec hold Seated Gentle Upper Trapezius Stretch - 2-3 x daily - 7 x weekly - 3 reps - 30 sec hold Gentle Levator Scapulae Stretch - 2-3 x daily - 7 x weekly - 3 reps - 30 sec hold Sternocleidomastoid Stretch - 2-3 x daily - 7 x weekly - 3 reps - 30 sec hold Doorway Pec Stretch at 60 Elevation - 2-3 x daily - 7 x weekly - 3 reps - 30 sec hold Sternocleidomastoid Release - 1-2 x daily - 7 x weekly

## 2021-08-31 NOTE — Therapy (Signed)
[facility] Outpatient Rehabilitation [facility] [address]  [address] [place], [place], [address] Phone: [phone]   Fax:  [phone]  Physical Therapy Treatment  Patient Details  Name: Scott [name] MRN: [id number] Date of Birth: Apr 26, 1946 Referring Provider (PT): Julio Sicks, MD   Encounter Date: 08/31/2021   PT End of Session - 08/31/21 1100     Visit Number 2    Number of Visits 15    Date for PT Re-Evaluation 10/19/21    Authorization Type [facility]    Authorization - Visit Number 2    Authorization - Number of Visits 15    PT Start Time 1100    PT Stop Time 1147    PT Time Calculation (min) 47 min    Activity Tolerance Patient tolerated treatment well    Behavior During Therapy [facility] for tasks assessed/performed             Past Medical History:  Diagnosis Date   Arthritis    Bilateral Hands   Diabetes mellitus without complication (HCC)    GERD (gastroesophageal reflux disease)    HIV (human immunodeficiency virus infection) (HCC)    Hypertension     Past Surgical History:  Procedure Laterality Date   ANTERIOR CERVICAL DECOMP/DISCECTOMY FUSION N/A 03/11/2021   Procedure: Anterior Cervical discectomy and Fusion  - Cervical three-Cervical four - Cervical four-Cervical five - Cervical five -Cervical six;  Surgeon: Julio Sicks, MD;  Location: [facility] OR;  Service: Neurosurgery;  Laterality: N/A;    There were no vitals filed for this visit.   Subjective Assessment - 08/31/21 1104     Subjective Pt reporting more pain at surgical site today. Also notes continued trouble with swallowing.    Patient Stated Goals "to get where this pain will go away so I can get in and out of bed w/o pain"    Currently in Pain? Yes    Pain Score 5     Pain Location Neck    Pain Orientation Anterior   at site of incision   Pain Descriptors / Indicators Sore   "stuck"   Pain Type Chronic pain                               [facility] Adult PT  Treatment/Exercise - 08/31/21 1100       Exercises   Exercises Neck      Neck Exercises: Machines for Strengthening   UBE (Upper Arm Bike) L1.0 x 6 min (3' each fwd & back)      Neck Exercises: Seated   Neck Retraction 10 reps;5 secs    Other Seated Exercise B scap retraction 10 x 5"      Neck Exercises: Stretches   Upper Trapezius Stretch Right;Left;2 reps;30 seconds    Levator Stretch Right;Left;2 reps;30 seconds    Neck Stretch 2 reps;30 seconds    Neck Stretch Limitations R/L SCM stretches    Corner Stretch 2 reps;30 seconds    Corner Stretch Limitations low doorway pec stretch      Manual Therapy   Manual Therapy Soft tissue mobilization;Myofascial release;Passive ROM    Soft tissue mobilization STM +/- DTM to B SCM, scalenes, UT, LS, pecs and cervical paraspinals    Myofascial Release pin & stretch to R>L SCM; manual TPR to R>L scalenes, UT, LS, pecs and cervical paraspinals    Passive ROM gentle manual UT, LS and SCM stretches  PT Education - 08/31/21 1348     Education Details HEP update - gentle stretching - Access Code: [email or web address]    Person(s) Educated Patient    Methods Explanation;Demonstration;Verbal cues;Handout    Comprehension Verbalized understanding;Verbal cues required;Returned demonstration;Need further instruction              PT Short Term Goals - 08/31/21 1108       PT SHORT TERM GOAL #1   Title Patient will be independent with initial HEP    Status On-going    Target Date 09/14/21      PT SHORT TERM GOAL #2   Title Patient will verbalize/demonstrate good awareness of neutral spine posture and proper body mechanics for daily tasks    Status On-going    Target Date 09/21/21               PT Long Term Goals - 08/31/21 1108       PT LONG TERM GOAL #1   Title Patient will be independent with ongoing/advanced HEP for self-management at home    Status On-going    Target Date 10/19/21      PT LONG TERM  GOAL #2   Title Patient to demonstrate ability to achieve and maintain good spinal alignment/posturing    Status On-going    Target Date 10/19/21      PT LONG TERM GOAL #3   Title Patient to report reduction in frequency and intensity of cervicothoracic and periscapular pain by >/= 50% to allow for improved mobility and activity tolerance    Status On-going    Target Date 10/19/21      PT LONG TERM GOAL #4   Title Patient to improve cervical and B shoulder AROM to [facility] without pain provocation    Status On-going    Target Date 10/19/21      PT LONG TERM GOAL #5   Title Patient will demonstrate improved B shoulder strength to >/= 4+/5 and L grip strength to within 10# of R hand for functional UE use    Status On-going    Target Date 10/19/21      PT LONG TERM GOAL #6   Title Patient to report ability to perform transitional mobility (bed and car transfers), ADLs, and household activities without limitation due to pain, LOM or weakness    Status On-going    Target Date 10/19/21                   Plan - 08/31/21 1108     Clinical Impression Statement Scott [name] reports increased anterior neck pain at near surgical incision on anterior neck as well as continued difficulty with swallowing due to a feeling of lumps in his neck. Increased muscle tension noted t/o neck and upper shoulder musculature, R>L, which was addressed with manual STM, DTM and MFR/TPR followed by instruction in stretches for the involved muscles. Muscle tension improved with decreased pain reported by end of session. HEP instructions provided for stretches.    Comorbidities Cervical myelopathy s/p C3-4, C4-5, C5-6 ACDF on 03/11/21, DM, HTN, HIV, GERD    Rehab Potential Good    PT Frequency 2x / week    PT Duration 8 weeks    PT Treatment/Interventions ADLs/Self Care Home Management;Cryotherapy;Electrical Stimulation;Moist Heat;Functional mobility training;Therapeutic activities;Therapeutic exercise;Balance  training;Neuromuscular re-education;Patient/family education;Manual techniques;Passive range of motion;Dry needling;Taping    PT Next Visit Plan progress postural stretching and strengthening; review initial HEP PRN; manual therapy +/- modalities to address pain and abnormal muscle  tension    PT Home Exercise Plan Access Code: [email or web address] (11/30, updated 12/7)    Consulted and Agree with Plan of Care Patient             Patient will benefit from skilled therapeutic intervention in order to improve the following deficits and impairments:  Decreased activity tolerance, Decreased knowledge of precautions, Decreased mobility, Decreased range of motion, Decreased strength, Increased fascial restricitons, Increased muscle spasms, Impaired perceived functional ability, Impaired flexibility, Impaired UE functional use, Improper body mechanics, Postural dysfunction, Pain  Visit Diagnosis: Cervicalgia  Pain in thoracic spine  Abnormal posture  Stiffness of right shoulder, not elsewhere classified  Stiffness of left shoulder, not elsewhere classified  Muscle weakness (generalized)  Other symptoms and signs involving the musculoskeletal system     Problem List Patient Active Problem List   Diagnosis Date Noted   Cervical myelopathy (HCC) 03/11/2021    Marry Guan, PT 08/31/2021, 1:51 PM  [facility] [address]  [address] [place], [place], [address] Phone: [phone]   Fax:  [phone]  Name: Scott [name] MRN: [id number] Date of Birth: 18-Dec-1945

## 2021-09-02 ENCOUNTER — Other Ambulatory Visit: Payer: Self-pay

## 2021-09-02 ENCOUNTER — Ambulatory Visit: Payer: No Typology Code available for payment source | Admitting: Physical Therapy

## 2021-09-02 ENCOUNTER — Encounter: Payer: Self-pay | Admitting: Physical Therapy

## 2021-09-02 DIAGNOSIS — M542 Cervicalgia: Secondary | ICD-10-CM

## 2021-09-02 DIAGNOSIS — M6281 Muscle weakness (generalized): Secondary | ICD-10-CM

## 2021-09-02 DIAGNOSIS — M25611 Stiffness of right shoulder, not elsewhere classified: Secondary | ICD-10-CM

## 2021-09-02 DIAGNOSIS — M25612 Stiffness of left shoulder, not elsewhere classified: Secondary | ICD-10-CM

## 2021-09-02 DIAGNOSIS — M546 Pain in thoracic spine: Secondary | ICD-10-CM

## 2021-09-02 DIAGNOSIS — R293 Abnormal posture: Secondary | ICD-10-CM

## 2021-09-02 DIAGNOSIS — R29898 Other symptoms and signs involving the musculoskeletal system: Secondary | ICD-10-CM

## 2021-09-02 NOTE — Therapy (Signed)
[facility] [address]  [address] [place], [place], [address] Phone: [phone]   Fax:  [phone]  Physical Therapy Treatment  Patient Details  Name: Scott [name] MRN: [id number] Date of Birth: 01-Aug-1946 Referring Provider (PT): Julio Sicks, MD   Encounter Date: 09/02/2021   PT End of Session - 09/02/21 1019     Visit Number 3    Number of Visits 15    Date for PT Re-Evaluation 10/19/21    Authorization Type [facility]    Authorization - Visit Number 3    Authorization - Number of Visits 15    PT Start Time 1019    PT Stop Time 1107    PT Time Calculation (min) 48 min    Activity Tolerance Patient tolerated treatment well    Behavior During Therapy [facility] for tasks assessed/performed             Past Medical History:  Diagnosis Date   Arthritis    Bilateral Hands   Diabetes mellitus without complication (HCC)    GERD (gastroesophageal reflux disease)    HIV (human immunodeficiency virus infection) (HCC)    Hypertension     Past Surgical History:  Procedure Laterality Date   ANTERIOR CERVICAL DECOMP/DISCECTOMY FUSION N/A 03/11/2021   Procedure: Anterior Cervical discectomy and Fusion  - Cervical three-Cervical four - Cervical four-Cervical five - Cervical five -Cervical six;  Surgeon: Julio Sicks, MD;  Location: [facility] OR;  Service: Neurosurgery;  Laterality: N/A;    There were no vitals filed for this visit.   Subjective Assessment - 09/02/21 1022     Subjective Pt reporting he is still having pain in his L hand today but denies neck pain. Still feels like there is a lump in his neck making it difficult to swallow.    Patient Stated Goals "to get where this pain will go away so I can get in and out of bed w/o pain"    Currently in Pain? Yes    Pain Score 0-No pain    Pain Score 5    Pain Location Hand    Pain Orientation Left    Pain Descriptors / Indicators Throbbing;Aching    Pain Type Chronic pain     Pain Frequency Constant                               [facility] Adult PT Treatment/Exercise - 09/02/21 1019       Neck Exercises: Machines for Strengthening   UBE (Upper Arm Bike) L1.0 x 6 min (3' each fwd & back)      Neck Exercises: Theraband   Shoulder External Rotation 10 reps   yellow TB   Shoulder External Rotation Limitations cues for scap retraction    Horizontal ABduction 10 reps   yellow TB   Horizontal ABduction Limitations cues for scap retraction      Neck Exercises: Supine   Neck Retraction 5 reps;5 secs    Neck Retraction Limitations gentle into pillow   discontinued d/t pt c/o increased difficulty with breathing   Shoulder Flexion Both;10 reps    Shoulder Flexion Limitations + yellow TB shoulder horiz ABD/scap retraction isometric      Neck Exercises: Stretches   Chest Stretch 10 seconds    Chest Stretch Limitations snow angel stretch      Manual Therapy   Manual Therapy Soft tissue mobilization;Myofascial release;Joint mobilization  Joint Mobilization B 1st rib mobiliation    Soft tissue mobilization STM +/- DTM to R>L SCM, scalenes, UT, LS, pecs and cervical paraspinals    Myofascial Release pin & stretch to R>L SCM; manual TPR to R>L scalenes, UT, LS, pecs and cervical paraspinals                       PT Short Term Goals - 08/31/21 1108       PT SHORT TERM GOAL #1   Title Patient will be independent with initial HEP    Status On-going    Target Date 09/14/21      PT SHORT TERM GOAL #2   Title Patient will verbalize/demonstrate good awareness of neutral spine posture and proper body mechanics for daily tasks    Status On-going    Target Date 09/21/21               PT Long Term Goals - 08/31/21 1108       PT LONG TERM GOAL #1   Title Patient will be independent with ongoing/advanced HEP for self-management at home    Status On-going    Target Date 10/19/21      PT LONG TERM GOAL #2   Title Patient to  demonstrate ability to achieve and maintain good spinal alignment/posturing    Status On-going    Target Date 10/19/21      PT LONG TERM GOAL #3   Title Patient to report reduction in frequency and intensity of cervicothoracic and periscapular pain by >/= 50% to allow for improved mobility and activity tolerance    Status On-going    Target Date 10/19/21      PT LONG TERM GOAL #4   Title Patient to improve cervical and B shoulder AROM to [facility] without pain provocation    Status On-going    Target Date 10/19/21      PT LONG TERM GOAL #5   Title Patient will demonstrate improved B shoulder strength to >/= 4+/5 and L grip strength to within 10# of R hand for functional UE use    Status On-going    Target Date 10/19/21      PT LONG TERM GOAL #6   Title Patient to report ability to perform transitional mobility (bed and car transfers), ADLs, and household activities without limitation due to pain, LOM or weakness    Status On-going    Target Date 10/19/21                   Plan - 09/02/21 1107     Clinical Impression Statement Ziare reporting less/no pain in his neck today but still having L hand pain as well as continued sensation of the "lump" in his neck making it difficult to swallow. No issues reported with HEP stretches, and he denies need for review today, therefore proceeded with postural stretching and strengthening exercises in supine. Pt reporting increased difficulty with breathing when attempting gentle chin tuck in supine, but exercises otherwise well tolerated. He was able to achieve near full shoulder flexion ROM in supine with good carryover when he returned to sitting. Pt also noting L hand pain moving away from his fingers and more just into his palm by end of visit.    Comorbidities Cervical myelopathy s/p C3-4, C4-5, C5-6 ACDF on 03/11/21, DM, HTN, HIV, GERD    Rehab Potential Good    PT Frequency 2x / week    PT Duration 8  weeks    PT Treatment/Interventions  ADLs/Self Care Home Management;Cryotherapy;Electrical Stimulation;Moist Heat;Functional mobility training;Therapeutic activities;Therapeutic exercise;Balance training;Neuromuscular re-education;Patient/family education;Manual techniques;Passive range of motion;Dry needling;Taping    PT Next Visit Plan progress postural stretching and strengthening; review initial HEP PRN; manual therapy +/- modalities to address pain and abnormal muscle tension    PT Home Exercise Plan Access Code: [email or web address] (11/30, updated 12/7)    Consulted and Agree with Plan of Care Patient             Patient will benefit from skilled therapeutic intervention in order to improve the following deficits and impairments:  Decreased activity tolerance, Decreased knowledge of precautions, Decreased mobility, Decreased range of motion, Decreased strength, Increased fascial restricitons, Increased muscle spasms, Impaired perceived functional ability, Impaired flexibility, Impaired UE functional use, Improper body mechanics, Postural dysfunction, Pain  Visit Diagnosis: Cervicalgia  Pain in thoracic spine  Abnormal posture  Stiffness of right shoulder, not elsewhere classified  Stiffness of left shoulder, not elsewhere classified  Muscle weakness (generalized)  Other symptoms and signs involving the musculoskeletal system     Problem List Patient Active Problem List   Diagnosis Date Noted   Cervical myelopathy (HCC) 03/11/2021    Marry Guan, PT 09/02/2021, 11:43 AM  [facility] [address]  [address] [place], [place], [address] Phone: [phone]   Fax:  [phone]  Name: Yeshaya Vath MRN: [id number] Date of Birth: August 22, 1946

## 2021-09-05 ENCOUNTER — Other Ambulatory Visit: Payer: Self-pay

## 2021-09-05 ENCOUNTER — Encounter: Payer: Self-pay | Admitting: Physical Therapy

## 2021-09-05 ENCOUNTER — Ambulatory Visit: Payer: No Typology Code available for payment source | Admitting: Physical Therapy

## 2021-09-05 DIAGNOSIS — R29898 Other symptoms and signs involving the musculoskeletal system: Secondary | ICD-10-CM

## 2021-09-05 DIAGNOSIS — M546 Pain in thoracic spine: Secondary | ICD-10-CM

## 2021-09-05 DIAGNOSIS — M542 Cervicalgia: Secondary | ICD-10-CM

## 2021-09-05 DIAGNOSIS — M25612 Stiffness of left shoulder, not elsewhere classified: Secondary | ICD-10-CM

## 2021-09-05 DIAGNOSIS — R293 Abnormal posture: Secondary | ICD-10-CM

## 2021-09-05 DIAGNOSIS — M6281 Muscle weakness (generalized): Secondary | ICD-10-CM

## 2021-09-05 DIAGNOSIS — M25611 Stiffness of right shoulder, not elsewhere classified: Secondary | ICD-10-CM

## 2021-09-05 NOTE — Therapy (Signed)
[facility] [address]  [place], [place], [address] Phone: [phone]   Fax:  [phone]  Physical Therapy Treatment / Progress Note  Patient Details  Name: Scott [name] MRN: [id number] Date of Birth: 03-19-1946 Referring Provider (PT): Earnie Larsson, MD  Progress Note  Reporting Period 08/24/2021 to 09/05/2021  See note below for Objective Data and Assessment of Progress/Goals.     Encounter Date: 09/05/2021   PT End of Session - 09/05/21 1108     Visit Number 4    Number of Visits 15    Date for PT Re-Evaluation 10/19/21    Authorization Type VA    Authorization - Visit Number 4    Authorization - Number of Visits 15    PT Start Time 1108    PT Stop Time 1155    PT Time Calculation (min) 47 min    Activity Tolerance Patient tolerated treatment well    Behavior During Therapy WFL for tasks assessed/performed             Past Medical History:  Diagnosis Date   Arthritis    Bilateral Hands   Diabetes mellitus without complication (HCC)    GERD (gastroesophageal reflux disease)    HIV (human immunodeficiency virus infection) ([place])    Hypertension     Past Surgical History:  Procedure Laterality Date   ANTERIOR CERVICAL DECOMP/DISCECTOMY FUSION N/A 03/11/2021   Procedure: Anterior Cervical discectomy and Fusion  - Cervical three-Cervical four - Cervical four-Cervical five - Cervical five -Cervical six;  Surgeon: Earnie Larsson, MD;  Location: [facility];  Service: Neurosurgery;  Laterality: N/A;    There were no vitals filed for this visit.   Subjective Assessment - 09/05/21 1110     Subjective Pt reports the pain in his L hand is just there - it stays the same and doesn't go away. He denies neck pain. Still feels like there is a lump in his neck making it difficult to swallow.    Patient Stated Goals "to get where this pain will go away so I can get in and out of bed w/o pain"    Currently in  Pain? Yes    Pain Score 0-No pain    Pain Location Neck    Pain Score 6   5-6/10   Pain Location Hand    Pain Orientation Left    Pain Descriptors / Indicators Throbbing;Aching    Pain Type Chronic pain    Pain Frequency Constant                [facility] PT Assessment - 09/05/21 1108       Assessment   Medical Diagnosis Cervical spinal stenosis s/p C3-4, C4-5, C5-6 ACDF    Referring Provider (PT) Earnie Larsson, MD    Onset Date/Surgical Date 03/11/21    Next MD Visit 09/07/21      AROM   Right Shoulder Flexion 132 Degrees    Right Shoulder ABduction 115 Degrees    Right Shoulder Internal Rotation --   FIR to L1   Right Shoulder External Rotation --   FER to C7   Left Shoulder Flexion 128 Degrees    Left Shoulder ABduction 120 Degrees    Left Shoulder Internal Rotation --   FIR to L1   Left Shoulder External Rotation --   FER to C7   Cervical Flexion 46    Cervical Extension 40    Cervical - Right Side  Bend 28    Cervical - Left Side Bend 21    Cervical - Right Rotation 48    Cervical - Left Rotation 29      Strength   Right Shoulder Flexion 4+/5    Right Shoulder Extension 4+/5    Right Shoulder ABduction 4+/5    Right Shoulder Internal Rotation 4+/5    Right Shoulder External Rotation 4/5    Left Shoulder Flexion 4+/5    Left Shoulder Extension 4+/5    Left Shoulder ABduction 4+/5    Left Shoulder Internal Rotation 4+/5    Left Shoulder External Rotation 4/5    Right Hand Grip (lbs) 48   50, 48, 46   Left Hand Grip (lbs) 21   21, 21 21                          [facility] Adult PT Treatment/Exercise - 09/05/21 1108       Neck Exercises: Machines for Strengthening   UBE (Upper Arm Bike) L2.0 x 6 min (3' each fwd & back)                     PT Education - 09/05/21 1155     Education Details HEP update - yellow TB rows & scap retraction - Access Code: [place]; Posture and Body Mechanics education for typical daily tasks    Person(s)  Educated Patient    Methods Explanation;Demonstration;Verbal cues;Tactile cues;Handout    Comprehension Verbalized understanding;Verbal cues required;Tactile cues required;Returned demonstration;Need further instruction              PT Short Term Goals - 09/05/21 1113       PT SHORT TERM GOAL #1   Title Patient will be independent with initial HEP    Status Achieved   09/05/21     PT SHORT TERM GOAL #2   Title Patient will verbalize/demonstrate good awareness of neutral spine posture and proper body mechanics for daily tasks    Status On-going   09/05/21 - Posture & body mechanics ducation provided today   Target Date 09/21/21               PT Long Term Goals - 09/05/21 1143       PT LONG TERM GOAL #1   Title Patient will be independent with ongoing/advanced HEP for self-management at home    Status On-going    Target Date 10/19/21      PT LONG TERM GOAL #2   Title Patient to demonstrate ability to achieve and maintain good spinal alignment/posturing    Status On-going    Target Date 10/19/21      PT LONG TERM GOAL #3   Title Patient to report reduction in frequency and intensity of cervicothoracic and periscapular pain by >/= 50% to allow for improved mobility and activity tolerance    Status Partially Met   09/05/21 - Pt reports neck and periscapular pain becoming much less common, but still notes constant L hand pain   Target Date 10/19/21      PT LONG TERM GOAL #4   Title Patient to improve cervical and B shoulder AROM to [facility] without pain provocation    Status On-going   09/05/21 - B shoulder and cervical ROM improving   Target Date 10/19/21      PT LONG TERM GOAL #5   Title Patient will demonstrate improved B shoulder strength to >/= 4+/5 and L grip  strength to within 10# of R hand for functional UE use    Status Partially Met   09/05/21 - B shoulder strength improving (grossly 4+/5 with exception of ER 4/5) but no change in grip strength this far   Target  Date 10/19/21      PT LONG TERM GOAL #6   Title Patient to report ability to perform transitional mobility (bed and car transfers), ADLs, and household activities without limitation due to pain, LOM or weakness    Status On-going    Target Date 10/19/21                   Plan - 09/05/21 1114     Clinical Impression Statement Scott [name] is demonstrating good initial progress with PT, reporting less/no pain in his neck and periscapular muscles but still having constant L hand pain as well as continued sensation of the "lump" in his neck making it difficult to swallow. Initial HEP going well with no concerns - STG #1 met. Education provided for proper posture and body mechanics to promote improved spinal alignment and decreased strain on cervical spine with typical daily mobility and household tasks. He has demonstrated improvement in cervical and B shoulder ROM but still lacking full ROM. Improvements also noted in B shoulder strength for available ROM however L grip strength unchanged thus far. Scott [name] is pleased with his progress with PT and has good potential to continue to benefit skilled PT to further reduce pain and improve functional ROM and strength to allow resumption of normal daily activities w/o restriction or limitation.    Comorbidities Cervical myelopathy s/p C3-4, C4-5, C5-6 ACDF on 03/11/21, DM, HTN, HIV, GERD    Rehab Potential Good    PT Frequency 2x / week    PT Duration 8 weeks    PT Treatment/Interventions ADLs/Self Care Home Management;Cryotherapy;Electrical Stimulation;Moist Heat;Functional mobility training;Therapeutic activities;Therapeutic exercise;Balance training;Neuromuscular re-education;Patient/family education;Manual techniques;Passive range of motion;Dry needling;Taping    PT Next Visit Plan progress postural stretching and strengthening; L grip strengthening; review initial HEP PRN; manual therapy +/- modalities to address pain and abnormal muscle tension; review  posture & body mechanics PRN    PT Home Exercise Plan Access Code: [email or web address] (11/30, updated 12/7 & 12/12)    Consulted and Agree with Plan of Care Patient             Patient will benefit from skilled therapeutic intervention in order to improve the following deficits and impairments:  Decreased activity tolerance, Decreased knowledge of precautions, Decreased mobility, Decreased range of motion, Decreased strength, Increased fascial restricitons, Increased muscle spasms, Impaired perceived functional ability, Impaired flexibility, Impaired UE functional use, Improper body mechanics, Postural dysfunction, Pain  Visit Diagnosis: Cervicalgia  Pain in thoracic spine  Abnormal posture  Stiffness of right shoulder, not elsewhere classified  Stiffness of left shoulder, not elsewhere classified  Muscle weakness (generalized)  Other symptoms and signs involving the musculoskeletal system     Problem List Patient Active Problem List   Diagnosis Date Noted   Cervical myelopathy ([place]) 03/11/2021    Percival Spanish, PT 09/05/2021, 12:17 PM  [facility] [address]  [place], [place], [address] Phone: [phone]   Fax:  [phone]  Name: Scott [name] MRN: [id number] Date of Birth: 07-11-1946

## 2021-09-05 NOTE — Patient Instructions (Signed)
Access Code: [email or web address] URL: [email or web address] Date: 09/05/2021 Prepared by: Glenetta Hew  Exercises Seated Cervical Retraction - 2 x daily - 7 x weekly - 2 sets - 10 reps - 3-5 sec hold Seated Scapular Retraction - 2 x daily - 7 x weekly - 2 sets - 10 reps - 3-5 sec hold Seated Gentle Upper Trapezius Stretch - 2-3 x daily - 7 x weekly - 3 reps - 30 sec hold Gentle Levator Scapulae Stretch - 2-3 x daily - 7 x weekly - 3 reps - 30 sec hold Sternocleidomastoid Stretch - 2-3 x daily - 7 x weekly - 3 reps - 30 sec hold Doorway Pec Stretch at 60 Elevation - 2-3 x daily - 7 x weekly - 3 reps - 30 sec hold Sternocleidomastoid Release - 1-2 x daily - 7 x weekly Standing Shoulder Row with Anchored Resistance - 1 x daily - 7 x weekly - 2 sets - 10 reps - 5 sec hold Scapular Retraction with Resistance Advanced - 1 x daily - 7 x weekly - 2 sets - 10 reps - 5 sec hold  Patient Education Posture and Body Mechanics  Sleeping on Back  Place pillow under knees. A pillow with cervical support and a roll around waist are also helpful. Copyright  VHI. All rights reserved.  Sleeping on Side Place pillow between knees. Use cervical support under neck and a roll around waist as needed. Copyright  VHI. All rights reserved.   Sleeping on Stomach   If this is the only desirable sleeping position, place pillow under lower legs, and under stomach or chest as needed.  Posture - Sitting   Sit upright, head facing forward. Try using a roll to support lower back. Keep shoulders relaxed, and avoid rounded back. Keep hips level with knees. Avoid crossing legs for long periods. Stand to Sit / Sit to Stand   To sit: Bend knees to lower self onto front edge of chair, then scoot back on seat. To stand: Reverse sequence by placing one foot forward, and scoot to front of seat. Use rocking motion to stand up.   Work Height and Reach  Ideal work height is no more than 2 to 4 inches below elbow  level when standing, and at elbow level when sitting. Reaching should be limited to arm's length, with elbows slightly bent.  Bending  Bend at hips and knees, not back. Keep feet shoulder-width apart.    Posture - Standing   Good posture is important. Avoid slouching and forward head thrust. Maintain curve in low back and align ears over shoul- ders, hips over ankles.  Alternating Positions   Alternate tasks and change positions frequently to reduce fatigue and muscle tension. Take rest breaks. Computer Work   Position work to Art gallery manager. Use proper work and seat height. Keep shoulders back and down, wrists straight, and elbows at right angles. Use chair that provides full back support. Add footrest and lumbar roll as needed.  Getting Into / Out of Car  Lower self onto seat, scoot back, then bring in one leg at a time. Reverse sequence to get out.  Dressing  Lie on back to pull socks or slacks over feet, or sit and bend leg while keeping back straight.    Housework - Sink  Place one foot on ledge of cabinet under sink when standing at sink for prolonged periods.   Pushing / Pulling  Pushing is preferable to pulling. Keep back in proper alignment, and use  leg muscles to do the work.  Deep Squat   Squat and lift with both arms held against upper trunk. Tighten stomach muscles without holding breath. Use smooth movements to avoid jerking.  Avoid Twisting   Avoid twisting or bending back. Pivot around using foot movements, and bend at knees if needed when reaching for articles.  Carrying Luggage   Distribute weight evenly on both sides. Use a cart whenever possible. Do not twist trunk. Move body as a unit.   Lifting Principles Maintain proper posture and head alignment. Slide object as close as possible before lifting. Move obstacles out of the way. Test before lifting; ask for help if too heavy. Tighten stomach muscles without holding breath. Use smooth  movements; do not jerk. Use legs to do the work, and pivot with feet. Distribute the work load symmetrically and close to the center of trunk. Push instead of pull whenever possible.   Ask For Help   Ask for help and delegate to others when possible. Coordinate your movements when lifting together, and maintain the low back curve.  Log Roll   Lying on back, bend left knee and place left arm across chest. Roll all in one movement to the right. Reverse to roll to the left. Always move as one unit. Housework - Sweeping  Use long-handled equipment to avoid stooping.   Housework - Wiping  Position yourself as close as possible to reach work surface. Avoid straining your back.  Laundry - Unloading Wash   To unload small items at bottom of washer, lift leg opposite to arm being used to reach.  Gardening - Raking  Move close to area to be raked. Use arm movements to do the work. Keep back straight and avoid twisting.     Cart  When reaching into cart with one arm, lift opposite leg to keep back straight.   Getting Into / Out of Bed  Lower self to lie down on one side by raising legs and lowering head at the same time. Use arms to assist moving without twisting. Bend both knees to roll onto back if desired. To sit up, start from lying on side, and use same move-ments in reverse. Housework - Vacuuming  Hold the vacuum with arm held at side. Step back and forth to move it, keeping head up. Avoid twisting.   Laundry - Armed forces training and education officer so that bending and twisting can be avoided.   Laundry - Unloading Dryer  Squat down to reach into clothes dryer or use a reacher.  Gardening - Weeding / Psychiatric nurse or Kneel. Knee pads may be helpful.

## 2021-09-08 ENCOUNTER — Other Ambulatory Visit (HOSPITAL_BASED_OUTPATIENT_CLINIC_OR_DEPARTMENT_OTHER): Payer: Self-pay | Admitting: Neurosurgery

## 2021-09-08 ENCOUNTER — Ambulatory Visit: Payer: No Typology Code available for payment source | Admitting: Physical Therapy

## 2021-09-08 ENCOUNTER — Other Ambulatory Visit: Payer: Self-pay

## 2021-09-08 ENCOUNTER — Encounter: Payer: Self-pay | Admitting: Physical Therapy

## 2021-09-08 DIAGNOSIS — M4802 Spinal stenosis, cervical region: Secondary | ICD-10-CM

## 2021-09-08 DIAGNOSIS — R293 Abnormal posture: Secondary | ICD-10-CM

## 2021-09-08 DIAGNOSIS — M542 Cervicalgia: Secondary | ICD-10-CM

## 2021-09-08 DIAGNOSIS — M6281 Muscle weakness (generalized): Secondary | ICD-10-CM

## 2021-09-08 DIAGNOSIS — M546 Pain in thoracic spine: Secondary | ICD-10-CM

## 2021-09-08 DIAGNOSIS — R29898 Other symptoms and signs involving the musculoskeletal system: Secondary | ICD-10-CM

## 2021-09-08 DIAGNOSIS — M25612 Stiffness of left shoulder, not elsewhere classified: Secondary | ICD-10-CM

## 2021-09-08 DIAGNOSIS — M25611 Stiffness of right shoulder, not elsewhere classified: Secondary | ICD-10-CM

## 2021-09-08 NOTE — Patient Instructions (Signed)

## 2021-09-08 NOTE — Therapy (Signed)
[facility] [address]  [place], [place], [address] Phone: [phone]   Fax:  [phone]  Physical Therapy Treatment  Patient Details  Name: Scott [name] MRN: [id number] Date of Birth: [birth year] Referring Provider (PT): Earnie Larsson, MD   Encounter Date: 09/08/2021   PT End of Session - 09/08/21 1051     Visit Number 5    Number of Visits 15    Date for PT Re-Evaluation 10/19/21    Authorization Type [facility]    Authorization - Visit Number 5    Authorization - Number of Visits 15    Progress Note Due on Visit 95   MD PN for appt on 09/07/21 completed on visit #4 - 09/05/21   PT Start Time 1051    PT Stop Time 1138    PT Time Calculation (min) 47 min    Activity Tolerance Patient tolerated treatment well    Behavior During Therapy [facility] for tasks assessed/performed             Past Medical History:  Diagnosis Date   Arthritis    Bilateral Hands   Diabetes mellitus without complication ([place])    GERD (gastroesophageal reflux disease)    HIV (human immunodeficiency virus infection) ([place])    Hypertension     Past Surgical History:  Procedure Laterality Date   ANTERIOR CERVICAL DECOMP/DISCECTOMY FUSION N/A 03/11/2021   Procedure: Anterior Cervical discectomy and Fusion  - Cervical three-Cervical four - Cervical four-Cervical five - Cervical five -Cervical six;  Surgeon: Earnie Larsson, MD;  Location: [place];  Service: Neurosurgery;  Laterality: N/A;    There were no vitals filed for this visit.   Subjective Assessment - 09/08/21 1054     Subjective Pt reports his f/u with the MD left him frustrated as the MD couldn't answer his questions regarding the difficulty he is still having with swallowing. Pt states MD will be scheduling him for a MRI.    Patient Stated Goals "to get where this pain will go away so I can get in and out of bed w/o pain"    Currently in Pain? Yes    Pain Score 3     Pain Location  Thoracic   between the shoulder blades   Pain Orientation Mid    Pain Descriptors / Indicators Burning    Pain Type Chronic pain    Pain Relieving Factors extra strength Tylenol    Pain Score 5    Pain Location Hand    Pain Orientation Left    Pain Descriptors / Indicators Dull;Aching;Numbness    Pain Type Chronic pain    Pain Frequency Constant                [facility] PT Assessment - 09/08/21 1051       Assessment   Next MD Visit TBD                           [facility] Adult PT Treatment/Exercise - 09/08/21 1051       Neck Exercises: Machines for Strengthening   UBE (Upper Arm Bike) L2.5 x 6 min (3' each fwd & back)      Neck Exercises: Stretches   Upper Trapezius Stretch Right;Left;2 reps;30 seconds    Levator Stretch Right;Left;2 reps;30 seconds    Other Neck Stretches L ant/mid/post scalene stretches with towel holding shoulder down 2 x 30 sec  Hand Exercises for Cervical Radiculopathy   Other Hand Exercise for Cervical Radiculopathy L brachial plexus nerve glide x 10      Manual Therapy   Manual Therapy Soft tissue mobilization;Myofascial release;Joint mobilization;Passive ROM    Manual therapy comments skilled palpation and monitoring during DN    Joint Mobilization L>R 1st rib mobiliation    Soft tissue mobilization STM +/- DTM to R>L SCM, scalenes, UT, LS, pecs and cervical paraspinals    Myofascial Release pin & stretch to L>R SCM, UT & LS; manual TPR to L>R scalenes, UT, LS, pecs and cervical paraspinals    Passive ROM gentle manual UT, LS and SCM stretches              Trigger Point Dry Needling - 09/08/21 1051     Consent Given? Yes    Education Handout Provided Yes    Muscles Treated Head and Neck Upper trapezius;Levator scapulae   Left   Upper Trapezius Response Twitch reponse elicited;Palpable increased muscle length    Levator Scapulae Response Twitch response elicited;Palpable increased muscle length                    PT Education - 09/08/21 1121     Education Details DN rational, procedure, outcomes, potential side effects, and recommended post-treatment exercises/activity    Person(s) Educated Patient    Methods Explanation;Handout    Comprehension Verbalized understanding              PT Short Term Goals - 09/08/21 1138       PT SHORT TERM GOAL #1   Title Patient will be independent with initial HEP    Status Achieved   09/05/21     PT SHORT TERM GOAL #2   Title Patient will verbalize/demonstrate good awareness of neutral spine posture and proper body mechanics for daily tasks    Status Achieved   09/08/21              PT Long Term Goals - 09/05/21 1143       PT LONG TERM GOAL #1   Title Patient will be independent with ongoing/advanced HEP for self-management at home    Status On-going    Target Date 10/19/21      PT LONG TERM GOAL #2   Title Patient to demonstrate ability to achieve and maintain good spinal alignment/posturing    Status On-going    Target Date 10/19/21      PT LONG TERM GOAL #3   Title Patient to report reduction in frequency and intensity of cervicothoracic and periscapular pain by >/= 50% to allow for improved mobility and activity tolerance    Status Partially Met   09/05/21 - Pt reports neck and periscapular pain becoming much less common, but still notes constant L hand pain   Target Date 10/19/21      PT LONG TERM GOAL #4   Title Patient to improve cervical and B shoulder AROM to [facility] without pain provocation    Status On-going   09/05/21 - B shoulder and cervical ROM improving   Target Date 10/19/21      PT LONG TERM GOAL #5   Title Patient will demonstrate improved B shoulder strength to >/= 4+/5 and L grip strength to within 10# of R hand for functional UE use    Status Partially Met   09/05/21 - B shoulder strength improving (grossly 4+/5 with exception of ER 4/5) but no change in grip strength this far  Target Date 10/19/21       PT LONG TERM GOAL #6   Title Patient to report ability to perform transitional mobility (bed and car transfers), ADLs, and household activities without limitation due to pain, LOM or weakness    Status On-going    Target Date 10/19/21                   Plan - 09/08/21 1138     Clinical Impression Statement Scott [name] expressing frustration regarding MDs apparent indifference to his concerns regarding his ongoing swallowing difficulties as well as the continued pain in his L hand - thinks he will seek out a second opinion after the first of the year. He reports good understanding on the posture and body mechanics education provided last visit and denies need for further review - STG #2 met. Attempted to introduce brachial plexus nerve glide to address ongoing pain and abnormal sensation in L hand, but limited coordination on movement pattern as well as limited neck ROM due to persistent tightness and L shoulder elevation. Increased muscle tension evident t/o lateral neck and upper shoulder musculature, esp UT, LS and scalenes. Limited response to manual TPR therefore discussed trial of DN to address abnormal muscle tension. After explanation of DN rational, procedures, outcomes and potential side effects, patient verbalized consent to DN treatment in conjunction with manual STM/DTM and TPR to reduce ttp/muscle tension. Muscles treated include L UT and LS. DN produced normal response with good twitches elicited resulting in palpable reduction in pain/ttp and muscle tension as well as improved ROM during stretches. Pt educated to expect mild to moderate muscle soreness for up to 24-48 hrs and instructed to continue prescribed home exercise program and current activity level with pt verbalizing understanding of theses instructions.    Comorbidities Cervical myelopathy s/p C3-4, C4-5, C5-6 ACDF on 03/11/21, DM, HTN, HIV, GERD    Rehab Potential Good    PT Frequency 2x / week    PT Duration 8 weeks     PT Treatment/Interventions ADLs/Self Care Home Management;Cryotherapy;Electrical Stimulation;Moist Heat;Functional mobility training;Therapeutic activities;Therapeutic exercise;Balance training;Neuromuscular re-education;Patient/family education;Manual techniques;Passive range of motion;Dry needling;Taping    PT Next Visit Plan assess response to DN; progress postural stretching and strengthening; L grip strengthening; review & update HEP PRN; manual therapy including DN as indicated and benefit noted +/- modalities to address pain and abnormal muscle tension; review posture & body mechanics PRN    PT Home Exercise Plan Access Code: [email or web address] (11/30, updated 12/7 & 12/12)    Consulted and Agree with Plan of Care Patient             Patient will benefit from skilled therapeutic intervention in order to improve the following deficits and impairments:  Decreased activity tolerance, Decreased knowledge of precautions, Decreased mobility, Decreased range of motion, Decreased strength, Increased fascial restricitons, Increased muscle spasms, Impaired perceived functional ability, Impaired flexibility, Impaired UE functional use, Improper body mechanics, Postural dysfunction, Pain  Visit Diagnosis: Cervicalgia  Pain in thoracic spine  Abnormal posture  Stiffness of right shoulder, not elsewhere classified  Stiffness of left shoulder, not elsewhere classified  Muscle weakness (generalized)  Other symptoms and signs involving the musculoskeletal system     Problem List Patient Active Problem List   Diagnosis Date Noted   Cervical myelopathy ([place]) 03/11/2021    Percival Spanish, PT 09/08/2021, 12:07 PM  [facility] [address]  [place], [place], [address] Phone: [phone]  Fax:  [phone]  Name: Scott [name] MRN: [id number] Date of Birth: Jul 05, 1946

## 2021-09-12 ENCOUNTER — Ambulatory Visit: Payer: No Typology Code available for payment source | Admitting: Physical Therapy

## 2021-09-12 ENCOUNTER — Encounter: Payer: Self-pay | Admitting: Physical Therapy

## 2021-09-12 ENCOUNTER — Other Ambulatory Visit: Payer: Self-pay

## 2021-09-12 DIAGNOSIS — M6281 Muscle weakness (generalized): Secondary | ICD-10-CM

## 2021-09-12 DIAGNOSIS — M25612 Stiffness of left shoulder, not elsewhere classified: Secondary | ICD-10-CM

## 2021-09-12 DIAGNOSIS — M25611 Stiffness of right shoulder, not elsewhere classified: Secondary | ICD-10-CM

## 2021-09-12 DIAGNOSIS — M542 Cervicalgia: Secondary | ICD-10-CM | POA: Diagnosis not present

## 2021-09-12 DIAGNOSIS — R293 Abnormal posture: Secondary | ICD-10-CM

## 2021-09-12 DIAGNOSIS — R29898 Other symptoms and signs involving the musculoskeletal system: Secondary | ICD-10-CM

## 2021-09-12 DIAGNOSIS — M546 Pain in thoracic spine: Secondary | ICD-10-CM

## 2021-09-12 NOTE — Patient Instructions (Signed)
° °  Access Code: [email or web address] URL: [email or web address] Date: 09/12/2021 Prepared by: Glenetta Hew  Exercises Seated Cervical Retraction - 2 x daily - 7 x weekly - 2 sets - 10 reps - 3-5 sec hold Seated Scapular Retraction - 2 x daily - 7 x weekly - 2 sets - 10 reps - 3-5 sec hold Seated Gentle Upper Trapezius Stretch - 2-3 x daily - 7 x weekly - 3 reps - 30 sec hold Gentle Levator Scapulae Stretch - 2-3 x daily - 7 x weekly - 3 reps - 30 sec hold Sternocleidomastoid Stretch - 2-3 x daily - 7 x weekly - 3 reps - 30 sec hold Doorway Pec Stretch at 60 Elevation - 2-3 x daily - 7 x weekly - 3 reps - 30 sec hold Sternocleidomastoid Release - 1-2 x daily - 7 x weekly Standing Shoulder Row with Anchored Resistance - 1 x daily - 7 x weekly - 2 sets - 10 reps - 5 sec hold Scapular Retraction with Resistance Advanced - 1 x daily - 7 x weekly - 2 sets - 10 reps - 5 sec hold Standing Shoulder Horizontal Abduction with Resistance - 1 x daily - 3 x weekly - 2 sets - 10 reps - 3 sec hold Standing Shoulder Diagonal Horizontal Abduction 60/120 Degrees with Resistance - 1 x daily - 3 x weekly - 2 sets - 10 reps - 3 sec hold Shoulder External Rotation and Scapular Retraction with Resistance - 1 x daily - 3 x weekly - 2 sets - 10 reps - 3 sec hold  Patient Education Hospital doctor Trigger Point Dry Needling

## 2021-09-12 NOTE — Therapy (Signed)
[facility] [address]  [place], [place], [address] Phone: [phone]   Fax:  [phone]  Physical Therapy Treatment  Patient Details  Name: Scott [name] MRN: [id number] Date of Birth: April 17, 1946 Referring Provider (PT): Earnie Larsson, MD   Encounter Date: 09/12/2021   PT End of Session - 09/12/21 1056     Visit Number 6    Number of Visits 15    Date for PT Re-Evaluation 10/19/21    Authorization Type VA    Authorization - Visit Number 6    Authorization - Number of Visits 15    Progress Note Due on Visit 61   MD PN for appt on 09/07/21 completed on visit #4 - 09/05/21   PT Start Time 1056    PT Stop Time 1148    PT Time Calculation (min) 52 min    Activity Tolerance Patient tolerated treatment well    Behavior During Therapy [facility] for tasks assessed/performed             Past Medical History:  Diagnosis Date   Arthritis    Bilateral Hands   Diabetes mellitus without complication ([place])    GERD (gastroesophageal reflux disease)    HIV (human immunodeficiency virus infection) ([place])    Hypertension     Past Surgical History:  Procedure Laterality Date   ANTERIOR CERVICAL DECOMP/DISCECTOMY FUSION N/A 03/11/2021   Procedure: Anterior Cervical discectomy and Fusion  - Cervical three-Cervical four - Cervical four-Cervical five - Cervical five -Cervical six;  Surgeon: Earnie Larsson, MD;  Location: [place];  Service: Neurosurgery;  Laterality: N/A;    There were no vitals filed for this visit.   Subjective Assessment - 09/12/21 1057     Subjective Pt denies pain other than his L hand or the continued trouble with swallowing. MRI scheduled for Sat 09/24/21.    Patient Stated Goals "to get where this pain will go away so I can get in and out of bed w/o pain"    Currently in Pain? Yes    Pain Score 0-No pain    Pain Location Neck   or thoracic   Pain Score 3    Pain Location Hand    Pain Orientation  Left    Pain Descriptors / Indicators Dull;Aching;Numbness    Pain Type Chronic pain    Pain Relieving Factors CBD cream                               [facility] Adult PT Treatment/Exercise - 09/12/21 1056       Exercises   Exercises Neck      Neck Exercises: Machines for Strengthening   UBE (Upper Arm Bike) L2.5 x 6 min (3' each fwd & back)      Neck Exercises: Theraband   Scapula Retraction 15 reps;Red   yellow TB x 5 reps, progressing to red TB   Scapula Retraction Limitations standing with mini-shoulder extension to neutral    Rows 15 reps   yellow TB   Rows Limitations standing - cues for scap retraction & depression    Shoulder External Rotation 10 reps   yellow TB   Shoulder External Rotation Limitations standing against pool noodle on wall - cues for scap retraction into noodle    Horizontal ABduction 10 reps   yellow TB   Horizontal ABduction Limitations standing against pool noodle on wall - cues for  scap retraction into noodle    Other Theraband Exercises Standing yellow TB UE diagonals x 10; standing against pool noodle on wall - cues for scap retraction into noodle      Neck Exercises: Standing   Wall Push Ups 5 reps    Wall Push Ups Limitations unable to achieve adequate wrist extension to perfor with hands on wall; attempted on green Pball but pt apprehensive of ball      Hand Exercises for Cervical Radiculopathy   Other Hand Exercise for Cervical Radiculopathy gentle L wrist and 5th digit stretches      Manual Therapy   Manual Therapy Soft tissue mobilization    Soft tissue mobilization gentle STM over L 5th MTP joint    Passive ROM gentle L 5th digit extension stretch                     PT Education - 09/12/21 1145     Education Details HEP update - postural strengthening progression - Access Code: [email or web address]    Person(s) Educated Patient    Methods Explanation;Demonstration;Verbal cues;Handout    Comprehension Verbalized  understanding;Verbal cues required;Returned demonstration;Need further instruction              PT Short Term Goals - 09/08/21 1138       PT SHORT TERM GOAL #1   Title Patient will be independent with initial HEP    Status Achieved   09/05/21     PT SHORT TERM GOAL #2   Title Patient will verbalize/demonstrate good awareness of neutral spine posture and proper body mechanics for daily tasks    Status Achieved   09/08/21              PT Long Term Goals - 09/12/21 1144       PT LONG TERM GOAL #1   Title Patient will be independent with ongoing/advanced HEP for self-management at home    Status On-going    Target Date 10/19/21      PT LONG TERM GOAL #2   Title Patient to demonstrate ability to achieve and maintain good spinal alignment/posturing    Status On-going    Target Date 10/19/21      PT LONG TERM GOAL #3   Title Patient to report reduction in frequency and intensity of cervicothoracic and periscapular pain by >/= 50% to allow for improved mobility and activity tolerance    Status Partially Met   09/05/21 - Pt reports neck and periscapular pain becoming much less common, but still notes constant L hand pain   Target Date 10/19/21      PT LONG TERM GOAL #4   Title Patient to improve cervical and B shoulder AROM to [facility] without pain provocation    Status Partially Met   09/12/21 - B shoulder WFL and cervical ROM improving   Target Date 10/19/21      PT LONG TERM GOAL #5   Title Patient will demonstrate improved B shoulder strength to >/= 4+/5 and L grip strength to within 10# of R hand for functional UE use    Status Partially Met   09/05/21 - B shoulder strength improving (grossly 4+/5 with exception of ER 4/5) but no change in grip strength this far   Target Date 10/19/21      PT LONG TERM GOAL #6   Title Patient to report ability to perform transitional mobility (bed and car transfers), ADLs, and household activities without limitation due to pain, LOM  or  weakness    Status On-going    Target Date 10/19/21                   Plan - 09/12/21 1102     Clinical Impression Statement Scott [name] reports DN last visit seemed to give him instant relief of the tension in his L shoulder and tension remains decreased today with no pain reported in neck, upper back or periscapular area. Reviewed HEP strengthening with rows/retraction clarifying movement patterns and advancing theraband resistance from yellow to red TB. HEP updated to include progression of postural/scapular strengthening with exercises progressed from supine to upright position with good tolerance. Attempted to introduce wall push-ups but limited due to limited L wrist and 5th digit extension ROM with potential ganglion cyst noted at L 5th digit MTP flexor tendon contributing to limited ROM.  Introduced gentle STM and stretching to promote improved ROM and will progress to grip strengthening as tolerated in upcoming visits.    Comorbidities Cervical myelopathy s/p C3-4, C4-5, C5-6 ACDF on 03/11/21, DM, HTN, HIV, GERD    Rehab Potential Good    PT Frequency 2x / week    PT Duration 8 weeks    PT Treatment/Interventions ADLs/Self Care Home Management;Cryotherapy;Electrical Stimulation;Moist Heat;Functional mobility training;Therapeutic activities;Therapeutic exercise;Balance training;Neuromuscular re-education;Patient/family education;Manual techniques;Passive range of motion;Dry needling;Taping    PT Next Visit Plan progress postural stretching and strengthening; L grip strengthening; review & update HEP PRN; manual therapy including DN as indicated and benefit noted +/- modalities to address pain and abnormal muscle tension; review posture & body mechanics PRN    PT Home Exercise Plan Access Code: [email or web address] (11/30, updated 12/7, 12/12 & 12/19)    Consulted and Agree with Plan of Care Patient             Patient will benefit from skilled therapeutic intervention in order to improve the  following deficits and impairments:  Decreased activity tolerance, Decreased knowledge of precautions, Decreased mobility, Decreased range of motion, Decreased strength, Increased fascial restricitons, Increased muscle spasms, Impaired perceived functional ability, Impaired flexibility, Impaired UE functional use, Improper body mechanics, Postural dysfunction, Pain  Visit Diagnosis: Cervicalgia  Pain in thoracic spine  Abnormal posture  Stiffness of right shoulder, not elsewhere classified  Stiffness of left shoulder, not elsewhere classified  Muscle weakness (generalized)  Other symptoms and signs involving the musculoskeletal system     Problem List Patient Active Problem List   Diagnosis Date Noted   Cervical myelopathy ([place]) 03/11/2021    Percival Spanish, PT 09/12/2021, 12:07 PM  [facility] [address]  [place], [place], [address] Phone: [phone]   Fax:  [phone]  Name: Scott [name] MRN: [id number] Date of Birth: Dec 30, 1945

## 2021-09-14 ENCOUNTER — Other Ambulatory Visit: Payer: Self-pay

## 2021-09-14 ENCOUNTER — Ambulatory Visit: Payer: No Typology Code available for payment source | Admitting: Physical Therapy

## 2021-09-14 DIAGNOSIS — M25611 Stiffness of right shoulder, not elsewhere classified: Secondary | ICD-10-CM

## 2021-09-14 DIAGNOSIS — M542 Cervicalgia: Secondary | ICD-10-CM | POA: Diagnosis not present

## 2021-09-14 DIAGNOSIS — R293 Abnormal posture: Secondary | ICD-10-CM

## 2021-09-14 DIAGNOSIS — M25612 Stiffness of left shoulder, not elsewhere classified: Secondary | ICD-10-CM

## 2021-09-14 DIAGNOSIS — R29898 Other symptoms and signs involving the musculoskeletal system: Secondary | ICD-10-CM

## 2021-09-14 DIAGNOSIS — M6281 Muscle weakness (generalized): Secondary | ICD-10-CM

## 2021-09-14 DIAGNOSIS — M546 Pain in thoracic spine: Secondary | ICD-10-CM

## 2021-09-14 NOTE — Therapy (Signed)
[facility] [address]  [place], [place], [address] Phone: [phone]   Fax:  [phone]  Physical Therapy Treatment  Patient Details  Name: Nixon Kolton MRN: [id number] Date of Birth: 09-May-1946 Referring Provider (PT): Earnie Larsson, MD   Encounter Date: 09/14/2021   PT End of Session - 09/14/21 1107     Visit Number 7    Number of Visits 15    Date for PT Re-Evaluation 10/19/21    Authorization Type VA    Authorization - Visit Number 7    Authorization - Number of Visits 15    Progress Note Due on Visit 52   MD PN for appt on 09/07/21 completed on visit #4 - 09/05/21   PT Start Time 1107    PT Stop Time 1150    PT Time Calculation (min) 43 min    Activity Tolerance Patient tolerated treatment well    Behavior During Therapy [facility] for tasks assessed/performed             Past Medical History:  Diagnosis Date   Arthritis    Bilateral Hands   Diabetes mellitus without complication ([place])    GERD (gastroesophageal reflux disease)    HIV (human immunodeficiency virus infection) ([employer])    Hypertension     Past Surgical History:  Procedure Laterality Date   ANTERIOR CERVICAL DECOMP/DISCECTOMY FUSION N/A 03/11/2021   Procedure: Anterior Cervical discectomy and Fusion  - Cervical three-Cervical four - Cervical four-Cervical five - Cervical five -Cervical six;  Surgeon: Earnie Larsson, MD;  Location: [place];  Service: Neurosurgery;  Laterality: N/A;    There were no vitals filed for this visit.   Subjective Assessment - 09/14/21 1107     Subjective Pt reports that neck has continued to do well. Continued problems with his L hand.    Patient Stated Goals "to get where this pain will go away so I can get in and out of bed w/o pain"    Currently in Pain? Yes    Pain Score 0-No pain    Pain Location Neck    Pain Score 3    Pain Location Hand                               [facility]  Adult PT Treatment/Exercise - 09/14/21 0001       Exercises   Exercises Wrist      Neck Exercises: Stretches   Other Neck Stretches L ant/mid/post scalene stretches supine and sitting x30 sec each      Wrist Exercises   Wrist Flexion Strengthening;Left;20 reps;Supine    Bar Weights/Barbell (Wrist Flexion) 2 lbs    Wrist Extension Strengthening;Left;20 reps;Supine    Bar Weights/Barbell (Wrist Extension) 2 lbs    Other wrist exercises wrist extensor stretch 2x30 sec; brachioradialis 2x10 with 2 lbs    Other wrist exercises wrist flexor stretch x30 sec      Manual Therapy   Joint Mobilization lateral cervical side glides grade II to III and manual traction 3x30 sec    Myofascial Release pin & stretch to L>R SCM, UT & LS; manual TPR to L>R scalenes, UT, LS, pecs and cervical paraspinals    Passive ROM gentle L 5th digit extension stretch                       PT Short Term Goals -  09/08/21 1138       PT SHORT TERM GOAL #1   Title Patient will be independent with initial HEP    Status Achieved   09/05/21     PT SHORT TERM GOAL #2   Title Patient will verbalize/demonstrate good awareness of neutral spine posture and proper body mechanics for daily tasks    Status Achieved   09/08/21              PT Long Term Goals - 09/12/21 1144       PT LONG TERM GOAL #1   Title Patient will be independent with ongoing/advanced HEP for self-management at home    Status On-going    Target Date 10/19/21      PT LONG TERM GOAL #2   Title Patient to demonstrate ability to achieve and maintain good spinal alignment/posturing    Status On-going    Target Date 10/19/21      PT LONG TERM GOAL #3   Title Patient to report reduction in frequency and intensity of cervicothoracic and periscapular pain by >/= 50% to allow for improved mobility and activity tolerance    Status Partially Met   09/05/21 - Pt reports neck and periscapular pain becoming much less common, but still  notes constant L hand pain   Target Date 10/19/21      PT LONG TERM GOAL #4   Title Patient to improve cervical and B shoulder AROM to [facility] without pain provocation    Status Partially Met   09/12/21 - B shoulder WFL and cervical ROM improving   Target Date 10/19/21      PT LONG TERM GOAL #5   Title Patient will demonstrate improved B shoulder strength to >/= 4+/5 and L grip strength to within 10# of R hand for functional UE use    Status Partially Met   09/05/21 - B shoulder strength improving (grossly 4+/5 with exception of ER 4/5) but no change in grip strength this far   Target Date 10/19/21      PT LONG TERM GOAL #6   Title Patient to report ability to perform transitional mobility (bed and car transfers), ADLs, and household activities without limitation due to pain, LOM or weakness    Status On-going    Target Date 10/19/21                   Plan - 09/14/21 1158     Clinical Impression Statement Pt has been able to maintain his neck pain at a low level. Continued tightness of anterior, mid and posterior scalene. Provided extensive manual therapy for his cervical tightness. Concern for nerve entrapment either here or in his wrist leading to his decreased sensation in his L hand. Worked on initiating wrist and hand strengthening this session and continuing wrist stretching. Discussed potentially seeing Celyn at [facility] for one session for TPDN of scalenes if needed.    Personal Factors and Comorbidities Age;Comorbidity 3+;Past/Current Experience;Time since onset of injury/illness/exacerbation;Fitness;Transportation    Comorbidities Cervical myelopathy s/p C3-4, C4-5, C5-6 ACDF on 03/11/21, DM, HTN, HIV, GERD    Rehab Potential Good    PT Frequency 2x / week    PT Duration 8 weeks    PT Treatment/Interventions ADLs/Self Care Home Management;Cryotherapy;Electrical Stimulation;Moist Heat;Functional mobility training;Therapeutic activities;Therapeutic exercise;Balance  training;Neuromuscular re-education;Patient/family education;Manual techniques;Passive range of motion;Dry needling;Taping    PT Next Visit Plan progress postural stretching and strengthening; L grip strengthening; review & update HEP PRN; manual therapy including DN  as indicated and benefit noted +/- modalities to address pain and abnormal muscle tension; review posture & body mechanics PRN    PT Home Exercise Plan Access Code: [email or web address] (11/30, updated 12/7, 12/12 & 12/19)    Consulted and Agree with Plan of Care Patient             Patient will benefit from skilled therapeutic intervention in order to improve the following deficits and impairments:  Decreased activity tolerance, Decreased knowledge of precautions, Decreased mobility, Decreased range of motion, Decreased strength, Increased fascial restricitons, Increased muscle spasms, Impaired perceived functional ability, Impaired flexibility, Impaired UE functional use, Improper body mechanics, Postural dysfunction, Pain  Visit Diagnosis: Cervicalgia  Pain in thoracic spine  Abnormal posture  Stiffness of right shoulder, not elsewhere classified  Stiffness of left shoulder, not elsewhere classified  Muscle weakness (generalized)  Other symptoms and signs involving the musculoskeletal system     Problem List Patient Active Problem List   Diagnosis Date Noted   Cervical myelopathy [facility]) 03/11/2021    [facility] April Gordy Levan, PT, DPT 09/14/2021, 12:08 PM  [facility] [address]  [place], [place], [address] Phone: [phone]   Fax:  [phone]  Name: Colbin Jovel MRN: [id number] Date of Birth: 17-Jun-1946

## 2021-09-16 ENCOUNTER — Encounter: Payer: No Typology Code available for payment source | Admitting: Physical Therapy

## 2021-09-20 ENCOUNTER — Ambulatory Visit: Payer: No Typology Code available for payment source | Admitting: Physical Therapy

## 2021-09-22 ENCOUNTER — Encounter: Payer: No Typology Code available for payment source | Admitting: Physical Therapy

## 2021-09-24 ENCOUNTER — Ambulatory Visit (HOSPITAL_BASED_OUTPATIENT_CLINIC_OR_DEPARTMENT_OTHER)
Admission: RE | Admit: 2021-09-24 | Discharge: 2021-09-24 | Disposition: A | Discharge status: Home / Self Care | Payer: No Typology Code available for payment source | Source: Ambulatory Visit | Attending: Neurosurgery | Admitting: Neurosurgery

## 2021-09-24 ENCOUNTER — Other Ambulatory Visit: Payer: Self-pay

## 2021-09-24 DIAGNOSIS — M4802 Spinal stenosis, cervical region: Secondary | ICD-10-CM | POA: Diagnosis present

## 2021-09-28 ENCOUNTER — Ambulatory Visit: Payer: No Typology Code available for payment source | Attending: Neurosurgery | Admitting: Physical Therapy

## 2021-09-28 ENCOUNTER — Other Ambulatory Visit: Payer: Self-pay

## 2021-09-28 ENCOUNTER — Encounter: Payer: Self-pay | Admitting: Physical Therapy

## 2021-09-28 DIAGNOSIS — R293 Abnormal posture: Secondary | ICD-10-CM | POA: Insufficient documentation

## 2021-09-28 DIAGNOSIS — M25612 Stiffness of left shoulder, not elsewhere classified: Secondary | ICD-10-CM | POA: Insufficient documentation

## 2021-09-28 DIAGNOSIS — M546 Pain in thoracic spine: Secondary | ICD-10-CM | POA: Insufficient documentation

## 2021-09-28 DIAGNOSIS — M6281 Muscle weakness (generalized): Secondary | ICD-10-CM | POA: Insufficient documentation

## 2021-09-28 DIAGNOSIS — R29898 Other symptoms and signs involving the musculoskeletal system: Secondary | ICD-10-CM | POA: Diagnosis present

## 2021-09-28 DIAGNOSIS — M25611 Stiffness of right shoulder, not elsewhere classified: Secondary | ICD-10-CM | POA: Insufficient documentation

## 2021-09-28 DIAGNOSIS — M542 Cervicalgia: Secondary | ICD-10-CM | POA: Diagnosis not present

## 2021-09-28 NOTE — Patient Instructions (Signed)
Access Code: [email or web address] URL: [email or web address] Date: 09/28/2021 Prepared by: Raynelle Fanning  Exercises Seated Cervical Retraction - 2 x daily - 7 x weekly - 2 sets - 10 reps - 3-5 sec hold Seated Scapular Retraction - 2 x daily - 7 x weekly - 2 sets - 10 reps - 3-5 sec hold Seated Gentle Upper Trapezius Stretch - 2-3 x daily - 7 x weekly - 3 reps - 30 sec hold Gentle Levator Scapulae Stretch - 2-3 x daily - 7 x weekly - 3 reps - 30 sec hold Sternocleidomastoid Stretch - 2-3 x daily - 7 x weekly - 3 reps - 30 sec hold Doorway Pec Stretch at 60 Elevation - 2-3 x daily - 7 x weekly - 3 reps - 30 sec hold Sternocleidomastoid Release - 1-2 x daily - 7 x weekly Standing Shoulder Row with Anchored Resistance - 1 x daily - 7 x weekly - 2 sets - 10 reps - 5 sec hold Scapular Retraction with Resistance Advanced - 1 x daily - 7 x weekly - 2 sets - 10 reps - 5 sec hold Standing Shoulder Horizontal Abduction with Resistance - 1 x daily - 3 x weekly - 2 sets - 10 reps - 3 sec hold Standing Shoulder Diagonal Horizontal Abduction 60/120 Degrees with Resistance - 1 x daily - 3 x weekly - 2 sets - 10 reps - 3 sec hold Shoulder External Rotation and Scapular Retraction with Resistance - 1 x daily - 3 x weekly - 2 sets - 10 reps - 3 sec hold Supine Anterior Scalene Stretch - 1 x daily - 7 x weekly - 3 sets - 20 sec hold Wrist Flexion with Dumbbell - 1 x daily - 7 x weekly - 3 sets - 10 reps Wrist Extension with Dumbbell - 1 x daily - 7 x weekly - 3 sets - 10 reps Standing Plantar Fascia Mobilization with Small Ball - 1 x daily - 7 x weekly - 1 sets - 1 min hold Standing Upper Trapezius Mobilization with Small Ball - 1 x daily - 7 x weekly - 1 sets - 1 min hold  Patient Education Hospital doctor Trigger Point Dry Needling

## 2021-09-28 NOTE — Therapy (Signed)
[facility] [address]  [place], [place], [address] Phone: [phone]   Fax:  [phone]  Physical Therapy Treatment  Patient Details  Name: Scott [name] MRN: [id number] Date of Birth: 1945-11-01 Referring Provider (PT): Earnie Larsson, MD   Encounter Date: 09/28/2021   PT End of Session - 09/28/21 1109     Visit Number 8    Number of Visits 15    Date for PT Re-Evaluation 10/19/21    Authorization Type VA    Authorization - Visit Number 8    Authorization - Number of Visits 15    PT Start Time 1105    PT Stop Time 1150    PT Time Calculation (min) 45 min    Activity Tolerance Patient tolerated treatment well    Behavior During Therapy [facility] for tasks assessed/performed             Past Medical History:  Diagnosis Date   Arthritis    Bilateral Hands   Diabetes mellitus without complication ([place])    GERD (gastroesophageal reflux disease)    HIV (human immunodeficiency virus infection) ([place])    Hypertension     Past Surgical History:  Procedure Laterality Date   ANTERIOR CERVICAL DECOMP/DISCECTOMY FUSION N/A 03/11/2021   Procedure: Anterior Cervical discectomy and Fusion  - Cervical three-Cervical four - Cervical four-Cervical five - Cervical five -Cervical six;  Surgeon: Earnie Larsson, MD;  Location: [place];  Service: Neurosurgery;  Laterality: N/A;    There were no vitals filed for this visit.   Subjective Assessment - 09/28/21 1111     Subjective Pt reporting increased pain into Lt UE and into bil UT region since Sunday. He had an MRI Saturday of his neck and back and sees MD 10/06/21.    Patient Stated Goals "to get where this pain will go away so I can get in and out of bed w/o pain"    Currently in Pain? Yes    Pain Score 5     Pain Location Neck    Pain Orientation Mid    Pain Descriptors / Indicators Burning                               [facility] Adult PT  Treatment/Exercise - 09/28/21 0001       Self-Care   Self-Care Other Self-Care Comments    Other Self-Care Comments  discussed and demonstrated use of towel roll in pillow for neck support and also discussed and practiced using no pillow as pt sleeps with his head on his hands. No pillow provides proper alignment of cspine when sleeping on his hands. Also did MFR in sitting for left palm and mid thoracic spine for TPR and tightness.      Neck Exercises: Machines for Strengthening   UBE (Upper Arm Bike) L2.5 x 6 min (3' each fwd & back)      Neck Exercises: Theraband   Scapula Retraction 5 reps    Scapula Retraction Limitations standing with mini-shoulder extension to neutral; pt with difficulty today so worked moved onto ER and ER with noodle    Shoulder External Rotation 10 reps;20 reps    Shoulder External Rotation Limitations 1st set no band; 2nd set Yellow; standing against pool noodle on wall - cues for scap retraction into noodle    Horizontal ABduction 10 reps   yellow TB   Horizontal ABduction Limitations  standing against pool noodle on wall - cues for scap retraction into noodle      Neck Exercises: Standing   Neck Retraction Limitations attempted but too difficult      Neck Exercises: Seated   Neck Retraction 10 reps;3 secs    Neck Retraction Limitations with intermitted TC's; also did retraction with cervical ext with PT assist x 10    Lateral Flexion Both;5 reps    Lateral Flexion Limitations to assess any change in LUE sx      Manual Therapy   Manual Therapy Manual Traction    Manual Traction gentle cervical manual traction 2x20 sec; relieved neck pain, but no change in LUE sx                     PT Education - 09/28/21 1159     Education Details MFR with ball to left palm and upper thoracic spine; sleeping posture (see self care)    Person(s) Educated Patient    Methods Explanation;Demonstration;Verbal cues;Handout    Comprehension Verbalized  understanding;Returned demonstration              PT Short Term Goals - 09/08/21 1138       PT SHORT TERM GOAL #1   Title Patient will be independent with initial HEP    Status Achieved   09/05/21     PT SHORT TERM GOAL #2   Title Patient will verbalize/demonstrate good awareness of neutral spine posture and proper body mechanics for daily tasks    Status Achieved   09/08/21              PT Long Term Goals - 09/12/21 1144       PT LONG TERM GOAL #1   Title Patient will be independent with ongoing/advanced HEP for self-management at home    Status On-going    Target Date 10/19/21      PT LONG TERM GOAL #2   Title Patient to demonstrate ability to achieve and maintain good spinal alignment/posturing    Status On-going    Target Date 10/19/21      PT LONG TERM GOAL #3   Title Patient to report reduction in frequency and intensity of cervicothoracic and periscapular pain by >/= 50% to allow for improved mobility and activity tolerance    Status Partially Met   09/05/21 - Pt reports neck and periscapular pain becoming much less common, but still notes constant L hand pain   Target Date 10/19/21      PT LONG TERM GOAL #4   Title Patient to improve cervical and B shoulder AROM to [facility] without pain provocation    Status Partially Met   09/12/21 - B shoulder WFL and cervical ROM improving   Target Date 10/19/21      PT LONG TERM GOAL #5   Title Patient will demonstrate improved B shoulder strength to >/= 4+/5 and L grip strength to within 10# of R hand for functional UE use    Status Partially Met   09/05/21 - B shoulder strength improving (grossly 4+/5 with exception of ER 4/5) but no change in grip strength this far   Target Date 10/19/21      PT LONG TERM GOAL #6   Title Patient to report ability to perform transitional mobility (bed and car transfers), ADLs, and household activities without limitation due to pain, LOM or weakness    Status On-going    Target Date  10/19/21  Plan - 09/28/21 1210     Clinical Impression Statement Pt presents today with increased pain in his LUE and upper back x 4 days. He reports he just woke up with more pain. We discussed sleeping posture and techniques were provided to improve cervcial alignment. We also worked on cervical retractions to see if they would help alleviate his UE sx, but these were difficult for Mr. Buresh. He has good ROM when PT provided assistance but would benefit from post neck strengthening. No relief was reported today. He did get some relief in the neck with gentle manual cervical traction but no change to left UE sx. He returns to MD 10/06/21 to discuss MRI results.    Comorbidities Cervical myelopathy s/p C3-4, C4-5, C5-6 ACDF on 03/11/21, DM, HTN, HIV, GERD    PT Treatment/Interventions ADLs/Self Care Home Management;Cryotherapy;Electrical Stimulation;Moist Heat;Functional mobility training;Therapeutic activities;Therapeutic exercise;Balance training;Neuromuscular re-education;Patient/family education;Manual techniques;Passive range of motion;Dry needling;Taping    PT Next Visit Plan check goals; progress postural stretching and strengthening; L grip strengthening; review & update HEP PRN; manual therapy including DN as indicated and benefit noted +/- modalities to address pain and abnormal muscle tension; review posture & body mechanics PRN; MD 10/06/21             Patient will benefit from skilled therapeutic intervention in order to improve the following deficits and impairments:  Decreased activity tolerance, Decreased knowledge of precautions, Decreased mobility, Decreased range of motion, Decreased strength, Increased fascial restricitons, Increased muscle spasms, Impaired perceived functional ability, Impaired flexibility, Impaired UE functional use, Improper body mechanics, Postural dysfunction, Pain  Visit Diagnosis: Cervicalgia  Pain in thoracic spine  Abnormal  posture     Problem List Patient Active Problem List   Diagnosis Date Noted   Cervical myelopathy ([place]) 03/11/2021   Madelyn Flavors, PT 09/28/2021, 12:17 PM  [facility] [address]  [place], [place], [address] Phone: [phone]   Fax:  [phone]  Name: Scott [name] MRN: [id number] Date of Birth: 12-14-1945

## 2021-09-29 ENCOUNTER — Ambulatory Visit: Payer: No Typology Code available for payment source | Admitting: Physical Therapy

## 2021-09-29 ENCOUNTER — Encounter: Payer: Self-pay | Admitting: Physical Therapy

## 2021-09-29 DIAGNOSIS — M25611 Stiffness of right shoulder, not elsewhere classified: Secondary | ICD-10-CM

## 2021-09-29 DIAGNOSIS — M542 Cervicalgia: Secondary | ICD-10-CM

## 2021-09-29 DIAGNOSIS — M25612 Stiffness of left shoulder, not elsewhere classified: Secondary | ICD-10-CM

## 2021-09-29 DIAGNOSIS — R293 Abnormal posture: Secondary | ICD-10-CM

## 2021-09-29 DIAGNOSIS — M546 Pain in thoracic spine: Secondary | ICD-10-CM

## 2021-09-29 DIAGNOSIS — R29898 Other symptoms and signs involving the musculoskeletal system: Secondary | ICD-10-CM

## 2021-09-29 NOTE — Therapy (Signed)
[facility] [address]  [place], [place], [address] Phone: [phone]   Fax:  [phone]  Physical Therapy Treatment  Patient Details  Name: Scott [name] MRN: [id number] Date of Birth: [birth year] Referring Provider (PT): Earnie Larsson, MD   Encounter Date: 09/29/2021   PT End of Session - 09/29/21 1104     Visit Number 9    Number of Visits 15    Date for PT Re-Evaluation 10/19/21    Authorization Type VA    Authorization - Visit Number 9    Authorization - Number of Visits 15    PT Start Time 1104    PT Stop Time 1150    PT Time Calculation (min) 46 min    Activity Tolerance Patient tolerated treatment well    Behavior During Therapy [facility] for tasks assessed/performed             Past Medical History:  Diagnosis Date   Arthritis    Bilateral Hands   Diabetes mellitus without complication ([place])    GERD (gastroesophageal reflux disease)    HIV (human immunodeficiency virus infection) ([place])    Hypertension     Past Surgical History:  Procedure Laterality Date   ANTERIOR CERVICAL DECOMP/DISCECTOMY FUSION N/A 03/11/2021   Procedure: Anterior Cervical discectomy and Fusion  - Cervical three-Cervical four - Cervical four-Cervical five - Cervical five -Cervical six;  Surgeon: Earnie Larsson, MD;  Location: [place];  Service: Neurosurgery;  Laterality: N/A;    There were no vitals filed for this visit.   Subjective Assessment - 09/29/21 1106     Subjective Patient reporting same pain in upper mid back and left UE.    Patient Stated Goals "to get where this pain will go away so I can get in and out of bed w/o pain"    Currently in Pain? Yes    Pain Score 5     Pain Location Neck    Pain Orientation Mid    Pain Descriptors / Indicators Burning                               [facility] Adult PT Treatment/Exercise - 09/29/21 0001       Neck Exercises: Machines for Strengthening   UBE  (Upper Arm Bike) L2.5 x 6 min (3' each fwd & back)      Neck Exercises: Theraband   Scapula Retraction 15 reps   yellow TB   Scapula Retraction Limitations standing with mini-shoulder extension to neutral; pt with difficulty today so worked moved onto ER and ER with noodle    Rows 15 reps   yellow TB   Rows Limitations standing - cues for scap retraction & depression    Shoulder External Rotation 15 reps   yellow TB   Shoulder External Rotation Limitations standing against pool noodle on wall - cues for scap retraction into noodle    Horizontal ABduction 15 reps   yellow TB   Horizontal ABduction Limitations standing against pool noodle on wall - cues for scap retraction into noodle    Other Theraband Exercises Standing yellow TB UE diagonals x 10 bil; standing against pool noodle on wall - cues for scap retraction into noodle      Neck Exercises: Standing   Wall Push Ups 10 reps    Wall Push Ups Limitations used 5# dumbbells in hands on wall since limited by wrist  ext      Neck Exercises: Stretches   Other Neck Stretches seated levator stretch with palms up and pinkies together; reach forward until feel stretch: 5x 5 sec hold      Manual Therapy   Manual Therapy Soft tissue mobilization    Soft tissue mobilization to left levator scapula and UT; bil upper throracic paraspinals                       PT Short Term Goals - 09/08/21 1138       PT SHORT TERM GOAL #1   Title Patient will be independent with initial HEP    Status Achieved   09/05/21     PT SHORT TERM GOAL #2   Title Patient will verbalize/demonstrate good awareness of neutral spine posture and proper body mechanics for daily tasks    Status Achieved   09/08/21              PT Long Term Goals - 09/29/21 1215       PT LONG TERM GOAL #1   Title Patient will be independent with ongoing/advanced HEP for self-management at home    Status On-going      PT LONG TERM GOAL #2   Title Patient to  demonstrate ability to achieve and maintain good spinal alignment/posturing    Status On-going      PT LONG TERM GOAL #3   Title Patient to report reduction in frequency and intensity of cervicothoracic and periscapular pain by >/= 50% to allow for improved mobility and activity tolerance    Status Partially Met                   Plan - 09/29/21 1212     Clinical Impression Statement Patient with same c/o pain today in left levator area. He tolerated TE well but still requires ongoing VCs for correct form. Additionally he continues to drop his head with TE and needs cues to keep his head neutral. We reviewed the importance of posture in decreasing levator pain. Good response to STM and new levator stretch. He will benefit from continued skilled PT to attain his LTGs.    Comorbidities Cervical myelopathy s/p C3-4, C4-5, C5-6 ACDF on 03/11/21, DM, HTN, HIV, GERD    PT Frequency 2x / week    PT Duration 8 weeks    PT Treatment/Interventions ADLs/Self Care Home Management;Cryotherapy;Electrical Stimulation;Moist Heat;Functional mobility training;Therapeutic activities;Therapeutic exercise;Balance training;Neuromuscular re-education;Patient/family education;Manual techniques;Passive range of motion;Dry needling;Taping    PT Next Visit Plan Sees MD 10/06/20. progress postural stretching and strengthening; L grip strengthening; review & update HEP PRN; manual therapy including DN as indicated and benefit noted +/- modalities to address pain and abnormal muscle tension; review posture & body mechanics PRN; MD 10/06/21             Patient will benefit from skilled therapeutic intervention in order to improve the following deficits and impairments:  Decreased activity tolerance, Decreased knowledge of precautions, Decreased mobility, Decreased range of motion, Decreased strength, Increased fascial restricitons, Increased muscle spasms, Impaired perceived functional ability, Impaired flexibility,  Impaired UE functional use, Improper body mechanics, Postural dysfunction, Pain  Visit Diagnosis: Cervicalgia  Pain in thoracic spine  Abnormal posture  Stiffness of right shoulder, not elsewhere classified  Stiffness of left shoulder, not elsewhere classified  Other symptoms and signs involving the musculoskeletal system     Problem List Patient Active Problem List   Diagnosis Date Noted  Cervical myelopathy ([place]) 03/11/2021    Senaida Lange, PT 09/29/2021, 12:18 PM  [facility] [address]  [place], [place], [address] Phone: [phone]   Fax:  [phone]  Name: Ranveer Wahlstrom MRN: [id number] Date of Birth: [birth year]

## 2021-10-03 ENCOUNTER — Ambulatory Visit: Payer: No Typology Code available for payment source | Admitting: Physical Therapy

## 2021-10-03 ENCOUNTER — Other Ambulatory Visit: Payer: Self-pay

## 2021-10-03 DIAGNOSIS — M546 Pain in thoracic spine: Secondary | ICD-10-CM

## 2021-10-03 DIAGNOSIS — R29898 Other symptoms and signs involving the musculoskeletal system: Secondary | ICD-10-CM

## 2021-10-03 DIAGNOSIS — R293 Abnormal posture: Secondary | ICD-10-CM

## 2021-10-03 DIAGNOSIS — M6281 Muscle weakness (generalized): Secondary | ICD-10-CM

## 2021-10-03 DIAGNOSIS — M542 Cervicalgia: Secondary | ICD-10-CM

## 2021-10-03 DIAGNOSIS — M25611 Stiffness of right shoulder, not elsewhere classified: Secondary | ICD-10-CM

## 2021-10-03 DIAGNOSIS — M25612 Stiffness of left shoulder, not elsewhere classified: Secondary | ICD-10-CM

## 2021-10-03 NOTE — Therapy (Signed)
[facility] [address]  [place], [place], [address] Phone: [phone]   Fax:  [phone]  Physical Therapy Treatment / Progress Note  Patient Details  Name: Scott [name] MRN: [id number] Date of Birth: [birth year] Referring Provider (PT): Earnie Larsson, MD  Progress Note  Reporting Period 09/05/2022 to 10/03/2021  See note below for Objective Data and Assessment of Progress/Goals.     Encounter Date: 10/03/2021   PT End of Session - 10/03/21 1317     Visit Number 10    Number of Visits 15    Date for PT Re-Evaluation 10/19/21    Authorization Type [facility]    Authorization - Visit Number 10    Authorization - Number of Visits 15    PT Start Time 0272    PT Stop Time 1401    PT Time Calculation (min) 44 min    Activity Tolerance Patient tolerated treatment well    Behavior During Therapy WFL for tasks assessed/performed             Past Medical History:  Diagnosis Date   Arthritis    Bilateral Hands   Diabetes mellitus without complication (HCC)    GERD (gastroesophageal reflux disease)    HIV (human immunodeficiency virus infection) ([place])    Hypertension     Past Surgical History:  Procedure Laterality Date   ANTERIOR CERVICAL DECOMP/DISCECTOMY FUSION N/A 03/11/2021   Procedure: Anterior Cervical discectomy and Fusion  - Cervical three-Cervical four - Cervical four-Cervical five - Cervical five -Cervical six;  Surgeon: Earnie Larsson, MD;  Location: [facility];  Service: Neurosurgery;  Laterality: N/A;    There were no vitals filed for this visit.   Subjective Assessment - 10/03/21 1320     Subjective Pt reports he had a bacd weekend - shuffling his feet and losing his balance more. Had his MRI and should be getting his results this week.    Patient Stated Goals "to get where this pain will go away so I can get in and out of bed w/o pain"    Currently in Pain? No/denies                [facility] PT  Assessment - 10/03/21 1317       Assessment   Medical Diagnosis Cervical spinal stenosis s/p C3-4, C4-5, C5-6 ACDF    Referring Provider (PT) Earnie Larsson, MD    Onset Date/Surgical Date 03/11/21    Next MD Visit 10/06/21      AROM   Right Shoulder Flexion 134 Degrees    Right Shoulder ABduction 135 Degrees    Right Shoulder Internal Rotation --   FIR to L1   Right Shoulder External Rotation --   FER to T2   Left Shoulder Flexion 131 Degrees    Left Shoulder ABduction 140 Degrees    Left Shoulder Internal Rotation --   FIR to T12   Left Shoulder External Rotation --   FER to T1   Cervical Flexion 46    Cervical Extension 40    Cervical - Right Side Bend 28    Cervical - Left Side Bend 27    Cervical - Right Rotation 58    Cervical - Left Rotation 54      Strength   Right Shoulder Flexion 4+/5    Right Shoulder Extension 5/5    Right Shoulder ABduction 5/5    Right Shoulder Internal Rotation 5/5    Right Shoulder  External Rotation 4+/5    Left Shoulder Flexion 4+/5    Left Shoulder Extension 5/5    Left Shoulder ABduction 5/5    Left Shoulder Internal Rotation 5/5    Left Shoulder External Rotation 4+/5    Right Hand Grip (lbs) 51   52, 50, 51   Left Hand Grip (lbs) 36.33   38, 35, 36                          [facility] Adult PT Treatment/Exercise - 10/03/21 1317       Bed Mobility   Bed Mobility Rolling Right;Rolling Left;Right Sidelying to Sit;Left Sidelying to Sit;Supine to Sit    Rolling Right Supervision/verbal cueing    Rolling Left Supervision/Verbal cueing    Right Sidelying to Sit Supervision/Verbal cueing    Left Sidelying to Sit Supervision/Verbal cueing    Supine to Sit Supervision/Verbal cueing    Supine to Sit Details (indicate cue type and reason) Provided review of log rolling anf sidelying to/from sit to minimize neck or back strain      Neck Exercises: Machines for Strengthening   UBE (Upper Arm Bike) L3.0 x 6 min (3' each fwd & back)       Neck Exercises: Stretches   Other Neck Stretches seated levator stretch with palms up and pinkies together; reach forward until feel stretch: 5 x 5 sec hold                       PT Short Term Goals - 09/08/21 1138       PT SHORT TERM GOAL #1   Title Patient will be independent with initial HEP    Status Achieved   09/05/21     PT SHORT TERM GOAL #2   Title Patient will verbalize/demonstrate good awareness of neutral spine posture and proper body mechanics for daily tasks    Status Achieved   09/08/21              PT Long Term Goals - 10/03/21 1325       PT LONG TERM GOAL #1   Title Patient will be independent with ongoing/advanced HEP for self-management at home    Status Partially Met   10/03/21 - met for current HEP   Target Date 10/19/21      PT LONG TERM GOAL #2   Title Patient to demonstrate ability to achieve and maintain good spinal alignment/posturing    Status Achieved   10/03/21   Target Date --      PT LONG TERM GOAL #3   Title Patient to report reduction in frequency and intensity of cervicothoracic and periscapular pain by >/= 50% to allow for improved mobility and activity tolerance    Status Achieved   10/03/21 - Pt reports 75% improvement in frequency & intensity of pain   Target Date --      PT LONG TERM GOAL #4   Title Patient to improve cervical and B shoulder AROM to [facility] without pain provocation    Status Achieved   10/03/21 - B shoulder and cervical ROM symmetrical and essentially WFL w/o pain   Target Date --      PT LONG TERM GOAL #5   Title Patient will demonstrate improved B shoulder strength to >/= 4+/5 and L grip strength to within 10# of R hand for functional UE use    Status Partially Met   10/03/21 - met  for B shoulder strength; L grip strength increased by 15# but not yet w/in 10# of R hand   Target Date 10/19/21      PT LONG TERM GOAL #6   Title Patient to report ability to perform transitional mobility (bed and car  transfers), ADLs, and household activities without limitation due to pain, LOM or weakness    Status Partially Met    Target Date 10/19/21                   Plan - 10/03/21 1359     Clinical Impression Statement Scott [name]. He notes improved awareness of his posture but did require some review of proper body mechanics with transfers in/out of bed as this remains one of the activities more likely to trigger his pain - pt able to transition supine to/from sit via sidelying and log rolling following review w/o pain reported. His cervical and B shoulder ROM now mostly symmetrical and essentially WFL. Overall B shoulder strength 4+/5 to 5/5 and L grip strength improved by 15# but still ~15# less than R grip. Scott [name], with goals #2, 3 and 4 met, and remaining [name] at least partially met. He currently has 2 more scheduled visits (5 visits remaining in [place] authorization if needed) and feels that he will be ready to transition to his HEP at that time barring any new concerns identified as of MD f/u and MRI results review on 10/06/21.    Comorbidities Cervical myelopathy s/p C3-4, C4-5, C5-6 ACDF on 03/11/21, DM, HTN, HIV, GERD    Rehab Potential Good    PT Frequency 2x / week    PT Duration 8 weeks    PT Treatment/Interventions ADLs/Self Care Home Management;Cryotherapy;Electrical Stimulation;Moist Heat;Functional mobility training;Therapeutic activities;Therapeutic exercise;Balance training;Neuromuscular re-education;Patient/family education;Manual techniques;Passive range of motion;Dry needling;Taping    PT Next Visit Plan review appropriate use of  gym equipment; progress postural stretching and strengthening; L grip strengthening; review & update HEP PRN; manual therapy including DN as indicated and benefit noted +/- modalities to address pain and  abnormal muscle tension; review posture & body mechanics PRN    PT Home Exercise Plan Access Code: [email or web address] (11/30, updated 12/7, 12/12, 12/19 & 1/4)    Consulted and Agree with Plan of Care Patient             Patient will benefit from skilled therapeutic intervention in order to improve the following deficits and impairments:  Decreased activity tolerance, Decreased knowledge of precautions, Decreased mobility, Decreased range of motion, Decreased strength, Increased fascial restricitons, Increased muscle spasms, Impaired perceived functional ability, Impaired flexibility, Impaired UE functional use, Improper body mechanics, Postural dysfunction, Pain  Visit Diagnosis: Cervicalgia  Pain in thoracic spine  Abnormal posture  Stiffness of right shoulder, not elsewhere classified  Stiffness of left shoulder, not elsewhere classified  Other symptoms and signs involving the musculoskeletal system  Muscle weakness (generalized)     Problem List Patient Active Problem List   Diagnosis Date Noted   Cervical myelopathy ([place]) 03/11/2021    Percival Spanish, PT 10/03/2021, 2:15 PM  [facility] [address]  [place], [place], [address] Phone: [phone]   Fax:  [phone]  Name: Scott [name] MRN: [id number] Date of Birth: 15-Apr-1946

## 2021-10-06 ENCOUNTER — Encounter: Payer: Self-pay | Admitting: Physical Therapy

## 2021-10-06 ENCOUNTER — Other Ambulatory Visit: Payer: Self-pay

## 2021-10-06 ENCOUNTER — Ambulatory Visit: Payer: No Typology Code available for payment source | Admitting: Physical Therapy

## 2021-10-06 DIAGNOSIS — M542 Cervicalgia: Secondary | ICD-10-CM | POA: Diagnosis not present

## 2021-10-06 DIAGNOSIS — M546 Pain in thoracic spine: Secondary | ICD-10-CM

## 2021-10-06 DIAGNOSIS — M25612 Stiffness of left shoulder, not elsewhere classified: Secondary | ICD-10-CM

## 2021-10-06 DIAGNOSIS — M6281 Muscle weakness (generalized): Secondary | ICD-10-CM

## 2021-10-06 DIAGNOSIS — R29898 Other symptoms and signs involving the musculoskeletal system: Secondary | ICD-10-CM

## 2021-10-06 DIAGNOSIS — M25611 Stiffness of right shoulder, not elsewhere classified: Secondary | ICD-10-CM

## 2021-10-06 DIAGNOSIS — R293 Abnormal posture: Secondary | ICD-10-CM

## 2021-10-06 NOTE — Patient Instructions (Signed)
° °  Access Code: [email or web address] URL: [email or web address] Date: 10/06/2021 Prepared by: Glenetta Hew  Exercises Seated Cervical Retraction - 2 x daily - 7 x weekly - 2 sets - 10 reps - 3-5 sec hold Seated Scapular Retraction - 2 x daily - 7 x weekly - 2 sets - 10 reps - 3-5 sec hold Seated Gentle Upper Trapezius Stretch - 2-3 x daily - 7 x weekly - 3 reps - 30 sec hold Gentle Levator Scapulae Stretch - 2-3 x daily - 7 x weekly - 3 reps - 30 sec hold Sternocleidomastoid Stretch - 2-3 x daily - 7 x weekly - 3 reps - 30 sec hold Doorway Pec Stretch at 60 Elevation - 2-3 x daily - 7 x weekly - 3 reps - 30 sec hold Sternocleidomastoid Release - 1-2 x daily - 7 x weekly Standing Shoulder Row with Anchored Resistance - 1 x daily - 3 x weekly - 2 sets - 10 reps - 5 sec hold Scapular Retraction with Resistance Advanced - 1 x daily - 3 x weekly - 2 sets - 10 reps - 5 sec hold Standing Shoulder Horizontal Abduction with Resistance - 1 x daily - 3 x weekly - 2 sets - 10 reps - 3 sec hold Standing Shoulder Diagonal Horizontal Abduction 60/120 Degrees with Resistance - 1 x daily - 3 x weekly - 2 sets - 10 reps - 3 sec hold Shoulder External Rotation and Scapular Retraction with Resistance - 1 x daily - 3 x weekly - 2 sets - 10 reps - 3 sec hold Supine Anterior Scalene Stretch - 1 x daily - 7 x weekly - 3 sets - 20 sec hold Wrist Flexion with Dumbbell - 1 x daily - 7 x weekly - 3 sets - 10 reps Wrist Extension with Dumbbell - 1 x daily - 7 x weekly - 3 sets - 10 reps Standing Plantar Fascia Mobilization with Small Ball - 1 x daily - 7 x weekly - 1 sets - 1 min hold Standing Upper Trapezius Mobilization with Small Ball - 1 x daily - 7 x weekly - 1 sets - 1 min hold Seated Rhomboid Stretch - 1-2 x daily - 7 x weekly - 3 reps - 20-30 sec hold  Patient Education Hospital doctor Trigger Point Dry Needling

## 2021-10-06 NOTE — Therapy (Signed)
[facility] [address]  [place], [place], [address] Phone: [phone]   Fax:  [phone]  Physical Therapy Treatment  Patient Details  Name: Scott [name] MRN: [id number] Date of Birth: 03/15/1946 Referring Provider (PT): Earnie Larsson, MD   Encounter Date: 10/06/2021   PT End of Session - 10/06/21 1350     Visit Number 11    Number of Visits 15    Date for PT Re-Evaluation 10/19/21    Authorization Type VA    Authorization - Visit Number 11    Authorization - Number of Visits 15    PT Start Time 1350    PT Stop Time 1435    PT Time Calculation (min) 45 min    Activity Tolerance Patient tolerated treatment well    Behavior During Therapy [facility] for tasks assessed/performed             Past Medical History:  Diagnosis Date   Arthritis    Bilateral Hands   Diabetes mellitus without complication ([place])    GERD (gastroesophageal reflux disease)    HIV (human immunodeficiency virus infection) ([place])    Hypertension     Past Surgical History:  Procedure Laterality Date   ANTERIOR CERVICAL DECOMP/DISCECTOMY FUSION N/A 03/11/2021   Procedure: Anterior Cervical discectomy and Fusion  - Cervical three-Cervical four - Cervical four-Cervical five - Cervical five -Cervical six;  Surgeon: Earnie Larsson, MD;  Location: [facility];  Service: Neurosurgery;  Laterality: N/A;    There were no vitals filed for this visit.   Subjective Assessment - 10/06/21 1358     Subjective Pt reports the MD stated everything was healing as expected and has been released for now but will go back in May for a final f/u visit. Pt states the MD was unable to answer any of his concerns related to his swallowing issues or the ongoing pain/numbness/tinling in his L hand (although he notes he is no longer having any the pain in his arm). He plans to bring up these concerns when he sees his PCP in a few weeks.    Patient Stated Goals "to get where  this pain will go away so I can get in and out of bed w/o pain"    Currently in Pain? Yes    Pain Score 2     Pain Location Neck    Pain Orientation Mid    Pain Descriptors / Indicators Sore    Pain Type Chronic pain                               [facility] Adult PT Treatment/Exercise - 10/06/21 1350       Neck Exercises: Machines for Strengthening   UBE (Upper Arm Bike) L3.0 x 6 min (3' each fwd & back)      Neck Exercises: Theraband   Scapula Retraction 10 reps;Green    Scapula Retraction Limitations standing with mini-shoulder extension to neutral - cues to avoid neck protraction    Rows 10 reps;Green    Rows Limitations standing - good scap retraction & depression    Shoulder External Rotation 10 reps;Green    Shoulder External Rotation Limitations standing against inside of doorframe - cues for scap retraction into doorframe    Horizontal ABduction 15 reps   yellow TB   Horizontal ABduction Limitations standing against inside of doorframe - cues for scap retraction into doorframe  Other Theraband Exercises Standing yellow TB UE diagonals x 10 bil; standing against inside of doorframe - cues for scap retraction into doorframe      Neck Exercises: Stretches   Upper Trapezius Stretch Right;Left;2 reps;30 seconds    Levator Stretch Right;Left;2 reps;30 seconds    Neck Stretch Limitations R/L SCM stretches    Other Neck Stretches Levator/rhomboid stretch with arm crossed over chest with elbows pressinf forward 2 x 20-30 sec                     PT Education - 10/06/21 1434     Education Details HEP review & update in prep fro transition to HEP - Access Code: [email or web address]    Person(s) Educated Patient    Methods Explanation;Demonstration;Verbal cues;Tactile cues;Handout    Comprehension Verbalized understanding;Verbal cues required;Tactile cues required;Returned demonstration              PT Short Term Goals - 09/08/21 1138       PT SHORT TERM  GOAL #1   Title Patient will be independent with initial HEP    Status Achieved   09/05/21     PT SHORT TERM GOAL #2   Title Patient will verbalize/demonstrate good awareness of neutral spine posture and proper body mechanics for daily tasks    Status Achieved   09/08/21              PT Long Term Goals - 10/03/21 1325       PT LONG TERM GOAL #1   Title Patient will be independent with ongoing/advanced HEP for self-management at home    Status Partially Met   10/03/21 - met for current HEP   Target Date 10/19/21      PT LONG TERM GOAL #2   Title Patient to demonstrate ability to achieve and maintain good spinal alignment/posturing    Status Achieved   10/03/21   Target Date --      PT LONG TERM GOAL #3   Title Patient to report reduction in frequency and intensity of cervicothoracic and periscapular pain by >/= 50% to allow for improved mobility and activity tolerance    Status Achieved   10/03/21 - Pt reports 75% improvement in frequency & intensity of pain   Target Date --      PT LONG TERM GOAL #4   Title Patient to improve cervical and B shoulder AROM to [facility] without pain provocation    Status Achieved   10/03/21 - B shoulder and cervical ROM symmetrical and essentially WFL w/o pain   Target Date --      PT LONG TERM GOAL #5   Title Patient will demonstrate improved B shoulder strength to >/= 4+/5 and L grip strength to within 10# of R hand for functional UE use    Status Partially Met   10/03/21 - met for B shoulder strength; L grip strength increased by 15# but not yet w/in 10# of R hand   Target Date 10/19/21      PT LONG TERM GOAL #6   Title Patient to report ability to perform transitional mobility (bed and car transfers), ADLs, and household activities without limitation due to pain, LOM or weakness    Status Partially Met    Target Date 10/19/21                   Plan - 10/06/21 1435     Clinical Impression Statement Scott [name] reports new technique for getting  in/out of bed is really making a difference. He states the MD has released him, with healing proceeding as expected although another f/u scheduled for May 2023. Scott [name] feels like would like to try to proceed with transition to HEP as of his final visit next week, so therapy session focusing on review, progression and update of HEP in prep for transition - pt with good recall and return demonstration with most exercises but did require more cueing with some of the stretches and more recent scapular strengthening exercises. Green TB provided for exercise progression with rows/retraction but cautioned pt to use level of resistance that challenges him without overwhelming him or making him shake too much. Scott [name] reports good comfort and understanding following HEP review. Will plan for final goal assessment and address any further concerns next visit and anticipate he will be ready to transition to his HEP +/- 30-day hold.    Comorbidities Cervical myelopathy s/p C3-4, C4-5, C5-6 ACDF on 03/11/21, DM, HTN, HIV, GERD    Rehab Potential Good    PT Frequency 2x / week    PT Duration 8 weeks    PT Treatment/Interventions ADLs/Self Care Home Management;Cryotherapy;Electrical Stimulation;Moist Heat;Functional mobility training;Therapeutic activities;Therapeutic exercise;Balance training;Neuromuscular re-education;Patient/family education;Manual techniques;Passive range of motion;Dry needling;Taping    PT Next Visit Plan review appropriate use of  gym equipment; final goal assessment & anticipate transition to HEP    PT Home Exercise Plan Access Code: [email or web address] (11/30, updated 12/7, 12/12, 12/19, 1/4 & 1/12)    Consulted and Agree with Plan of Care Patient             Patient will benefit from skilled therapeutic intervention in order to improve the following deficits and impairments:  Decreased activity tolerance, Decreased knowledge of precautions, Decreased mobility, Decreased range of motion, Decreased  strength, Increased fascial restricitons, Increased muscle spasms, Impaired perceived functional ability, Impaired flexibility, Impaired UE functional use, Improper body mechanics, Postural dysfunction, Pain  Visit Diagnosis: Cervicalgia  Pain in thoracic spine  Abnormal posture  Stiffness of right shoulder, not elsewhere classified  Stiffness of left shoulder, not elsewhere classified  Other symptoms and signs involving the musculoskeletal system  Muscle weakness (generalized)     Problem List Patient Active Problem List   Diagnosis Date Noted   Cervical myelopathy ([place]) 03/11/2021    Percival Spanish, PT 10/06/2021, 8:11 PM  [facility] [address]  [place], [place], [address] Phone: [phone]   Fax:  [phone]  Name: Scott [name] MRN: [id number] Date of Birth: November 21, 1945

## 2021-10-10 ENCOUNTER — Other Ambulatory Visit: Payer: Self-pay

## 2021-10-10 ENCOUNTER — Ambulatory Visit: Payer: No Typology Code available for payment source | Admitting: Physical Therapy

## 2021-10-10 ENCOUNTER — Encounter: Payer: Self-pay | Admitting: Physical Therapy

## 2021-10-10 DIAGNOSIS — M25612 Stiffness of left shoulder, not elsewhere classified: Secondary | ICD-10-CM

## 2021-10-10 DIAGNOSIS — R293 Abnormal posture: Secondary | ICD-10-CM

## 2021-10-10 DIAGNOSIS — M6281 Muscle weakness (generalized): Secondary | ICD-10-CM

## 2021-10-10 DIAGNOSIS — M546 Pain in thoracic spine: Secondary | ICD-10-CM

## 2021-10-10 DIAGNOSIS — M542 Cervicalgia: Secondary | ICD-10-CM | POA: Diagnosis not present

## 2021-10-10 DIAGNOSIS — M25611 Stiffness of right shoulder, not elsewhere classified: Secondary | ICD-10-CM

## 2021-10-10 DIAGNOSIS — R29898 Other symptoms and signs involving the musculoskeletal system: Secondary | ICD-10-CM

## 2021-10-10 NOTE — Therapy (Addendum)
[facility] [address]  [place], [place], [address] Phone: [phone]   Fax:  [phone]  Physical Therapy Treatment / Discharge Summary  Patient Details  Name: Scott [name] MRN: [id number] Date of Birth: [birth year] Referring Provider (PT): Earnie Larsson, MD  Progress Note  Reporting Period 10/03/2021 to 10/10/2021  See note below for Objective Data and Assessment of Progress/Goals.     Encounter Date: 10/10/2021   PT End of Session - 10/10/21 1108     Visit Number 12    Number of Visits 15    Date for PT Re-Evaluation 10/19/21    Authorization Type [facility]    Authorization - Visit Number 11    Authorization - Number of Visits 15    PT Start Time 1108    PT Stop Time 1153    PT Time Calculation (min) 45 min    Activity Tolerance Patient tolerated treatment well    Behavior During Therapy WFL for tasks assessed/performed             Past Medical History:  Diagnosis Date   Arthritis    Bilateral Hands   Diabetes mellitus without complication (HCC)    GERD (gastroesophageal reflux disease)    HIV (human immunodeficiency virus infection) ([place])    Hypertension     Past Surgical History:  Procedure Laterality Date   ANTERIOR CERVICAL DECOMP/DISCECTOMY FUSION N/A 03/11/2021   Procedure: Anterior Cervical discectomy and Fusion  - Cervical three-Cervical four - Cervical four-Cervical five - Cervical five -Cervical six;  Surgeon: Earnie Larsson, MD;  Location: [facility];  Service: Neurosurgery;  Laterality: N/A;    There were no vitals filed for this visit.   Subjective Assessment - 10/10/21 1112     Subjective Pt reports nothing more than the "usual" mild pain between his shoulder blades and in his L hand. Still feels ready to proceed with tansition to HEP as planned.    Patient Stated Goals "to get where this pain will go away so I can get in and out of bed w/o pain"    Currently in Pain? Yes    Pain Score  2    2-3/10   Pain Location Thoracic    Pain Orientation Posterior;Upper;Mid    Pain Descriptors / Indicators Sore    Pain Score 2   2-3/10   Pain Location Hand    Pain Orientation Left    Pain Descriptors / Indicators Dull;Aching;Numbness                [facility] PT Assessment - 10/10/21 1108       Assessment   Medical Diagnosis Cervical spinal stenosis s/p C3-4, C4-5, C5-6 ACDF    Referring Provider (PT) Earnie Larsson, MD    Onset Date/Surgical Date 03/11/21    Next MD Visit May 2023      Observation/Other Assessments   Focus on Therapeutic Outcomes (FOTO)  Neck = 61; predicted D/C FS = 60      AROM   Right Shoulder Flexion 150 Degrees    Right Shoulder ABduction 158 Degrees    Right Shoulder Internal Rotation --   FIR to T8   Right Shoulder External Rotation --   FER to T3   Left Shoulder Flexion 149 Degrees    Left Shoulder ABduction 160 Degrees    Left Shoulder Internal Rotation --   FIR to T8   Left Shoulder External Rotation --   FER to T3  Cervical Flexion 46    Cervical Extension 40    Cervical - Right Side Bend 28    Cervical - Left Side Bend 27    Cervical - Right Rotation 65    Cervical - Left Rotation 58      Strength   Right Shoulder Flexion 5/5    Right Shoulder Extension 5/5    Right Shoulder ABduction 5/5    Right Shoulder Internal Rotation 5/5    Right Shoulder External Rotation 4+/5    Left Shoulder Flexion 5/5    Left Shoulder Extension 5/5    Left Shoulder ABduction 5/5    Left Shoulder Internal Rotation 5/5    Left Shoulder External Rotation 4+/5    Right Hand Grip (lbs) 53   54, 52, 53   Left Hand Grip (lbs) 37   36, 35, 40                          [facility] Adult PT Treatment/Exercise - 10/10/21 1108       Neck Exercises: Machines for Strengthening   UBE (Upper Arm Bike) L3.0 x 6 min (3' each fwd & back)    Cybex Row 20# low row x 15    Lat Pull 10# x 10 seated    Other Machines for Strengthening 10# x 10 standing  straight arm pull down                       PT Short Term Goals - 09/08/21 1138       PT SHORT TERM GOAL #1   Title Patient will be independent with initial HEP    Status Achieved   09/05/21     PT SHORT TERM GOAL #2   Title Patient will verbalize/demonstrate good awareness of neutral spine posture and proper body mechanics for daily tasks    Status Achieved   09/08/21              PT Long Term Goals - 10/10/21 1115       PT LONG TERM GOAL #1   Title Patient will be independent with ongoing/advanced HEP for self-management at home    Status Achieved   10/10/21     PT LONG TERM GOAL #2   Title Patient to demonstrate ability to achieve and maintain good spinal alignment/posturing    Status Achieved   10/03/21     PT LONG TERM GOAL #3   Title Patient to report reduction in frequency and intensity of cervicothoracic and periscapular pain by >/= 50% to allow for improved mobility and activity tolerance    Status Achieved   10/03/21 - Pt reports 75% improvement in frequency & intensity of pain     PT LONG TERM GOAL #4   Title Patient to improve cervical and B shoulder AROM to [facility] without pain provocation    Status Achieved   10/03/21 - B shoulder and cervical ROM symmetrical and essentially WFL w/o pain     PT LONG TERM GOAL #5   Title Patient will demonstrate improved B shoulder strength to >/= 4+/5 and L grip strength to within 10# of R hand for functional UE use    Status Partially Met   10/10/21 - met for B shoulder strength, however L grip strength 16# less than R (L = 37#, R= 53#)     PT LONG TERM GOAL #6   Title Patient to report ability to perform  transitional mobility (bed and car transfers), ADLs, and household activities without limitation due to pain, LOM or weakness    Status Achieved   10/10/21                  Plan - 10/10/21 1133     Clinical Impression Statement Rontrell is pleased with his progress with PT with no further limitations noted  due to pain, LOM or weakness. He has demonstrated return of functional cervical and B shoulder ROM. His B shoulder strength is now 4+/5 to 5/5 and his grip strength has improved although his L non-dominant grip (37#) remains weaker than his R (53#). He feels confident with his HEP and we reviewed appropriate use of gym equipment to continue to promote further strengthening and improved function. All goals met except L grip strength >10# weaker than R. Montray feels ready to transition to his HEP but would like to remain on hold for 30 days in the event that issues would arise necessitating a return to PT.    Comorbidities Cervical myelopathy s/p C3-4, C4-5, C5-6 ACDF on 03/11/21, DM, HTN, HIV, GERD    Rehab Potential Good    PT Treatment/Interventions ADLs/Self Care Home Management;Cryotherapy;Electrical Stimulation;Moist Heat;Functional mobility training;Therapeutic activities;Therapeutic exercise;Balance training;Neuromuscular re-education;Patient/family education;Manual techniques;Passive range of motion;Dry needling;Taping    PT Next Visit Plan transition to HEP + 30-day hold    PT Home Exercise Plan Access Code: [email or web address] (11/30, updated 12/7, 12/12, 12/19, 1/4 & 1/12)    Consulted and Agree with Plan of Care Patient             Patient will benefit from skilled therapeutic intervention in order to improve the following deficits and impairments:  Decreased activity tolerance, Decreased knowledge of precautions, Decreased mobility, Decreased range of motion, Decreased strength, Increased fascial restricitons, Increased muscle spasms, Impaired perceived functional ability, Impaired flexibility, Impaired UE functional use, Improper body mechanics, Postural dysfunction, Pain  Visit Diagnosis: Cervicalgia  Pain in thoracic spine  Abnormal posture  Stiffness of right shoulder, not elsewhere classified  Stiffness of left shoulder, not elsewhere classified  Other symptoms and signs involving the  musculoskeletal system  Muscle weakness (generalized)     Problem List Patient Active Problem List   Diagnosis Date Noted   Cervical myelopathy ([place]) 03/11/2021    Percival Spanish, PT 10/10/2021, 12:12 PM  [facility] [address]  [place], [place], [address] Phone: [phone]   Fax:  [phone]  Name: Timber Lucarelli MRN: [id number] Date of Birth: [birth year]   PHYSICAL THERAPY DISCHARGE SUMMARY  Visits from Start of Care: 12  Current functional level related to goals / functional outcomes:   Refer to above clinical impression for status as of last visit on 10/10/2021. Patient was placed on hold for 30 days and has not needed to return to PT, therefore will proceed with discharge from PT for this episode.   Remaining deficits:   As above.   Education / Equipment:   HEP   Patient agrees to discharge. Patient goals were mostly met. Patient is being discharged due to being pleased with the current functional level.  Percival Spanish, PT, MPT 11/29/21, 8:33 AM  [facility] [place], [place], [address] Phone: [phone]   Fax:  [phone]

## 2022-11-12 IMAGING — MR MR CERVICAL SPINE W/O CM
4 of 7 series · 29 of 48 positions shown · non-contrast
Comparison: Previous MRI from 12/24/2020.

CLINICAL DATA: Initial evaluation for persistent pain status post
surgery in Tuesday February, 2021.

EXAM:
MRI CERVICAL SPINE WITHOUT CONTRAST
TECHNIQUE: Multiplanar, multisequence MR imaging of the cervical spine was
performed. No intravenous contrast was administered.

[Series 2: T2 · sagittal · 3.0mm · 0.69mm/px · 5 of 15 slices shown (1 of 4)]
[im 1/15]
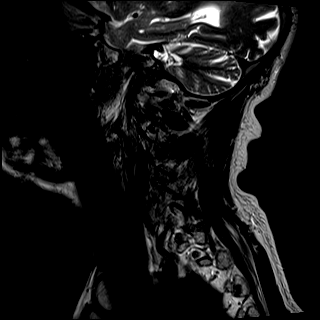
[im 4/15]
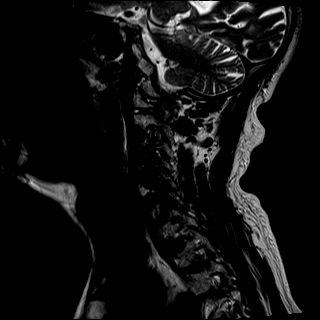
[im 8/15]
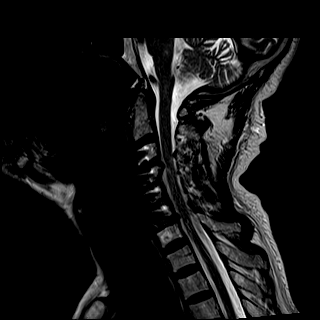
[im 11/15]
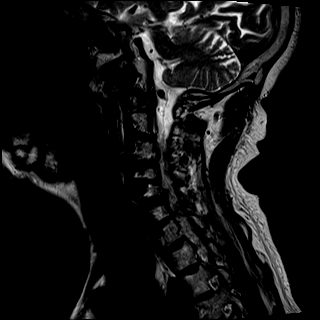
[im 15/15]
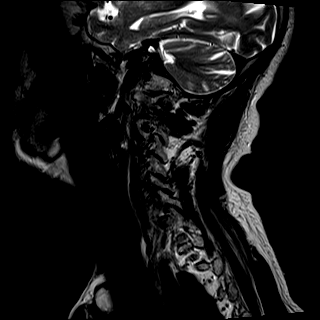

[Series 5: T2 · axial · 3.0mm · 0.62mm/px · z∈[-78,+32]mm · 11 of 34 slices shown (2 of 4)]
[im 1/34]
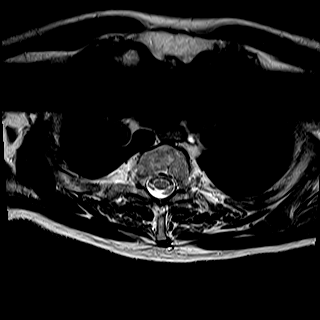
[im 4/34]
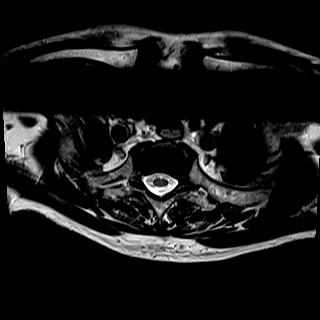
[im 7/34]
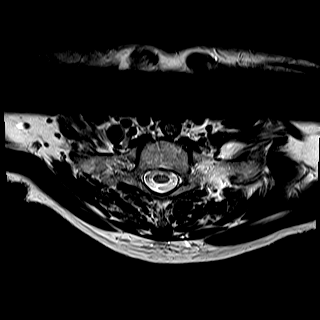
[im 10/34]
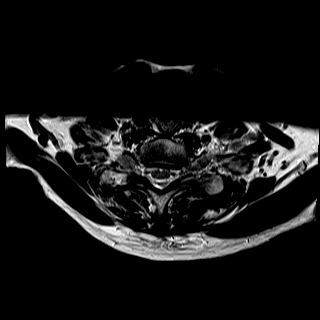
[im 14/34]
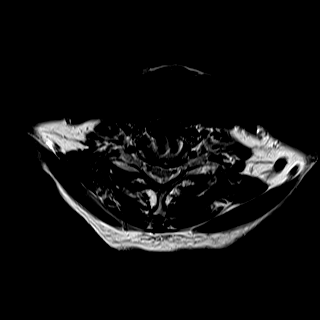
[im 17/34]
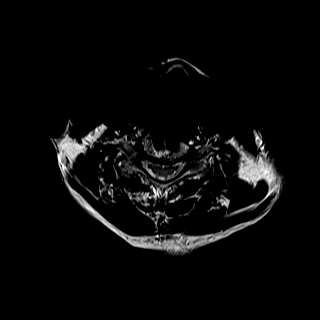
[im 20/34]
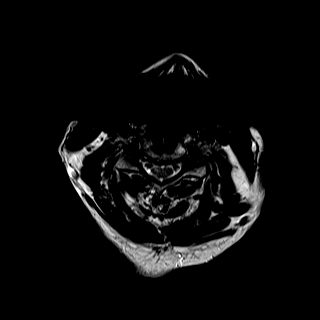
[im 24/34]
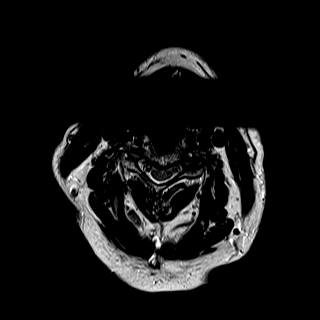
[im 27/34]
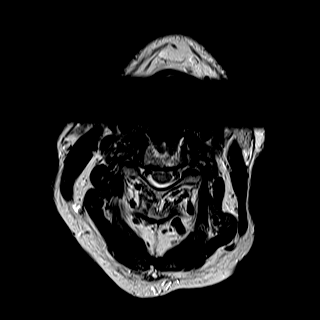
[im 30/34]
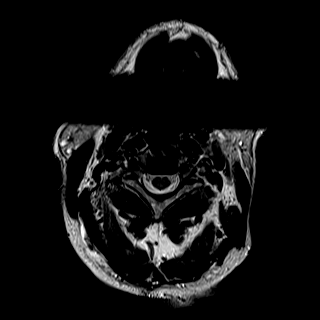
[im 34/34]
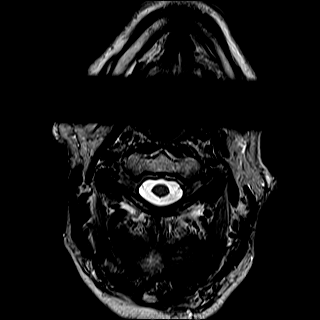

[Series 6: T2 · axial · 3.0mm · 0.39mm/px · z∈[-75,+22]mm · 10 of 34 slices shown (3 of 4)]
[im 1/34]
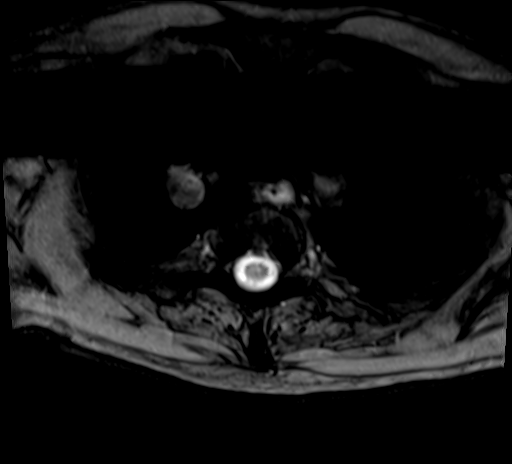
[im 4/34]
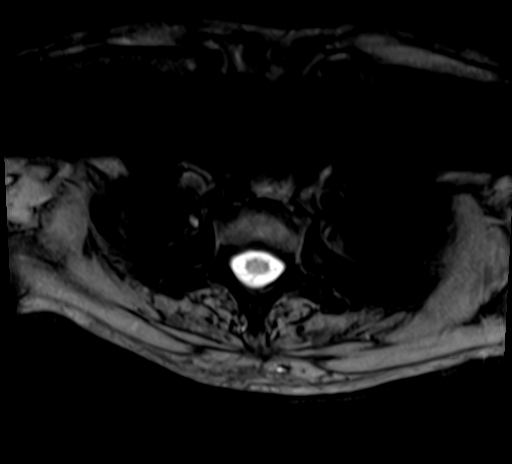
[im 7/34]
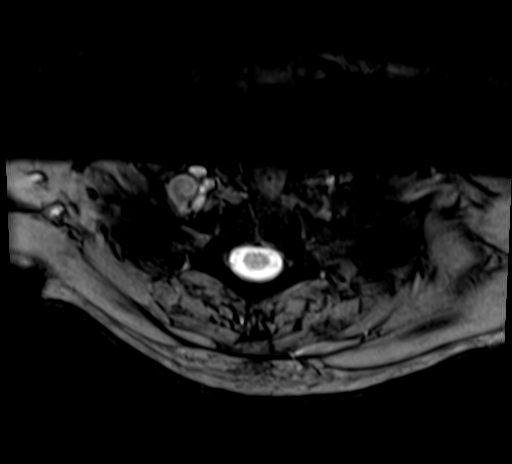
[im 10/34]
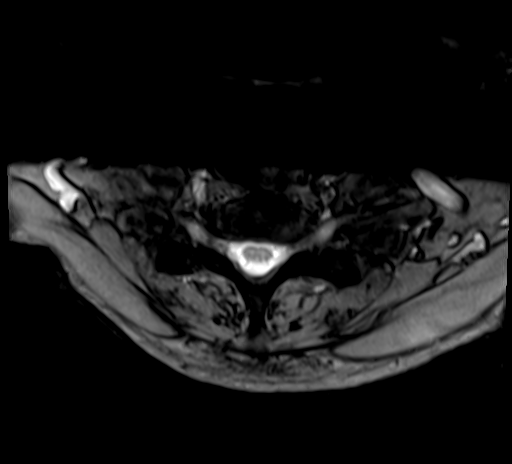
[im 14/34]
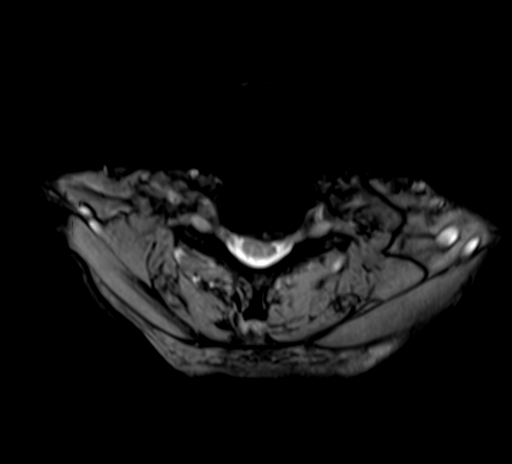
[im 17/34]
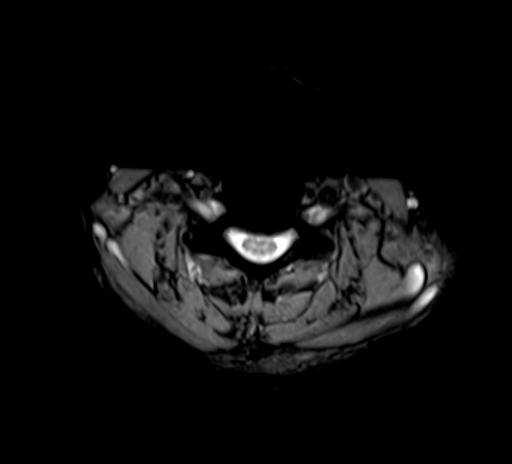
[im 20/34]
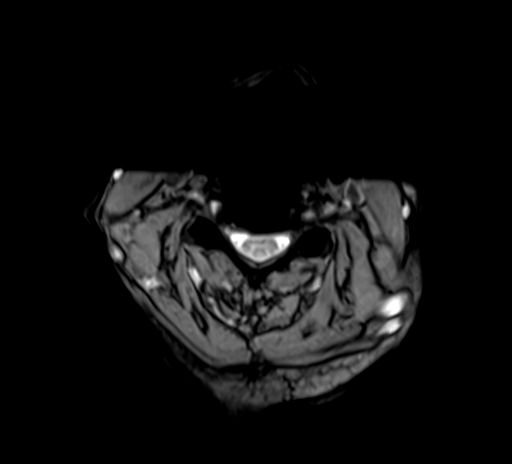
[im 24/34]
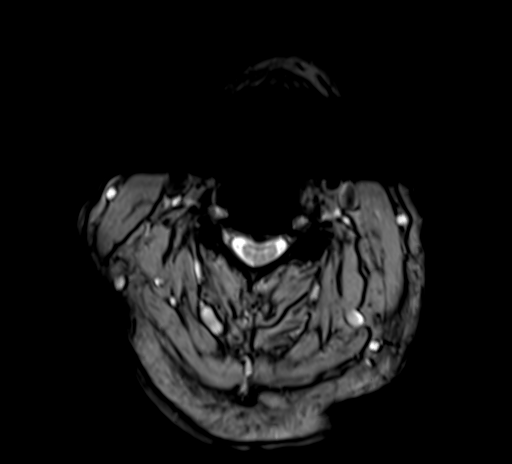
[im 27/34]
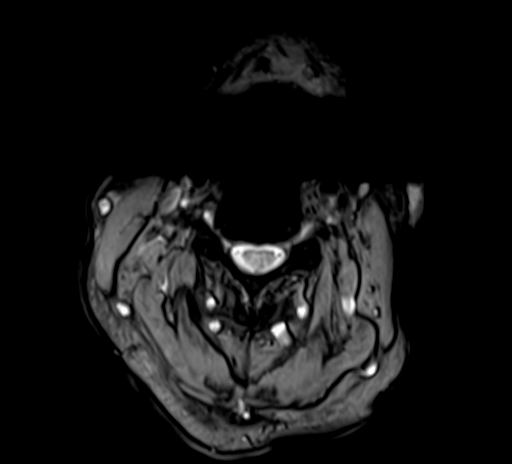
[im 30/34]
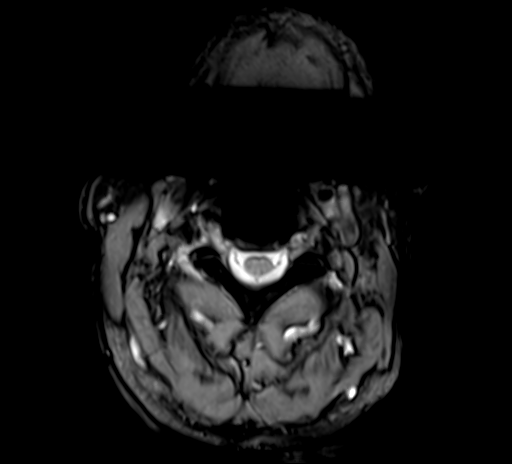

[Series 7: T2 · sagittal · 3.0mm · 0.69mm/px · 3 of 15 slices shown (4 of 4)]
[im 1/15]
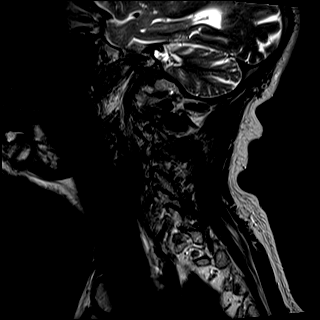
[im 8/15]
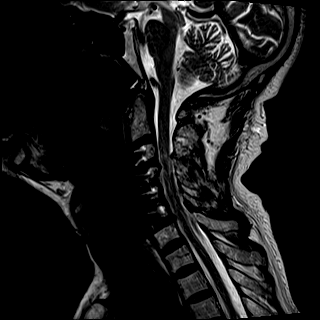
[im 15/15]
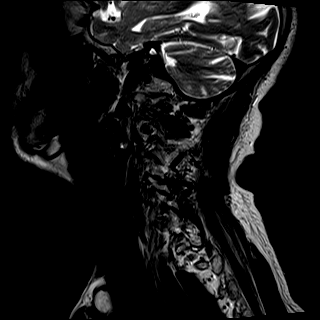

[29 of 48 positions shown; findings below may reference images not displayed]

FINDINGS: Alignment: Straightening of the normal cervical lordosis. No
interval listhesis.

Vertebrae: Postoperative changes from interval ACDF at C3 through
C6. Vertebral body height maintained without acute or interval
fracture. Bone marrow signal intensity heterogeneous with a few
scattered benign hemangiomata present. No worrisome osseous lesions.
No abnormal marrow edema.

Cord: Patchy signal abnormality involving the right hemi cord at the
level of C3-4, suspicious for myelomalacia (series 5, image 9).
Additional patchy signal abnormality seen involving the bilateral
hemi cord at the level of C4-5 (series 5, image 12). Additional
subtle focus of myelomalacia of involving the right hemi cord at the
level of C5 (series 5, image 15). Additional questionable focus of
signal abnormality involving the left dorsal cord at the level of C2
on axial T2 weighted sequence noted, favored to be artifactual
(series 5, image 6).

Posterior Fossa, vertebral arteries, paraspinal tissues: Visualized
brain and posterior fossa within normal limits. Craniocervical
junction normal. Paraspinous and prevertebral soft tissues within
normal limits. Normal flow voids seen within the vertebral arteries
bilaterally.

Disc levels:

C2-C3: Unremarkable.

C3-C4: Prior fusion. Residual posterior endplate spurring flattens
and indents the ventral thecal sac with persistent mild spinal
stenosis. Residual right greater than left uncovertebral spurring
with severe right with moderate left C4 foraminal narrowing.

C4-C5: Prior fusion. No significant residual spinal stenosis.
Residual uncovertebral hypertrophy with moderate right and mild left
C5 foraminal narrowing.

C5-C6: Prior fusion. No significant residual spinal stenosis. Right
worse than left uncovertebral spurring with residual severe right
and moderate left C6 foraminal narrowing.

C6-C7: Shallow left paracentral disc protrusion indents the left
ventral thecal sac (series 5, image 21). Associated annular fissure.
Mild flattening of the left hemi cord without cord signal changes.
Mild spinal stenosis. Foramina remain patent.

C7-T1: Negative interspace. Moderate left with mild right facet
hypertrophy. No spinal stenosis. Foramina remain patent.

Visualized upper thoracic spine demonstrates no significant finding.
IMPRESSION: 1. Postoperative changes from interval ACDF at C3 through C6.
Endplate osseous ridging at C3-4 with residual mild spinal stenosis.
2. Left paracentral disc protrusion at C6-7 with mild flattening of
the left hemi cord and mild spinal stenosis, but no cord signal
changes.
3. Residual uncovertebral spurring with resultant multilevel
foraminal narrowing as above. Notable findings include moderate left
with severe right C4 foraminal stenosis, moderate right C5 foraminal
narrowing, with severe right and moderate left C6 foraminal
stenosis.
4. Patchy signal abnormality involving the cervical spinal cord at
C3-4, C4-5, and C5-6 as above, most likely reflecting myelomalacia.
